# Patient Record
Sex: Male | Born: 1960
Health system: Southern US, Community
[De-identification: ages and names within clinical notes are randomized; demographics above are authoritative.]

## PROBLEM LIST (undated history)

## (undated) DIAGNOSIS — K449 Diaphragmatic hernia without obstruction or gangrene: Secondary | ICD-10-CM

## (undated) DIAGNOSIS — I10 Essential (primary) hypertension: Secondary | ICD-10-CM

## (undated) DIAGNOSIS — K2100 Gastro-esophageal reflux disease with esophagitis, without bleeding: Secondary | ICD-10-CM

## (undated) DIAGNOSIS — C801 Malignant (primary) neoplasm, unspecified: Secondary | ICD-10-CM

## (undated) DIAGNOSIS — F32A Depression, unspecified: Secondary | ICD-10-CM

## (undated) DIAGNOSIS — Z9989 Dependence on other enabling machines and devices: Secondary | ICD-10-CM

## (undated) DIAGNOSIS — R7989 Other specified abnormal findings of blood chemistry: Secondary | ICD-10-CM

## (undated) DIAGNOSIS — M1990 Unspecified osteoarthritis, unspecified site: Secondary | ICD-10-CM

## (undated) DIAGNOSIS — F329 Major depressive disorder, single episode, unspecified: Secondary | ICD-10-CM

## (undated) DIAGNOSIS — G4733 Obstructive sleep apnea (adult) (pediatric): Secondary | ICD-10-CM

## (undated) HISTORY — PX: REPLACEMENT TOTAL KNEE: SUR1224

## (undated) HISTORY — PX: SQUAMOUS CELL CARCINOMA EXCISION: SHX2433

## (undated) HISTORY — PX: OTHER SURGICAL HISTORY: SHX169

## (undated) HISTORY — DX: Diaphragmatic hernia without obstruction or gangrene: K44.9

## (undated) HISTORY — DX: Unspecified osteoarthritis, unspecified site: M19.90

## (undated) HISTORY — DX: Malignant (primary) neoplasm, unspecified: C80.1

## (undated) HISTORY — PX: KNEE ARTHROSCOPY: SUR90

## (undated) HISTORY — PX: BACK SURGERY: SHX140

## (undated) HISTORY — DX: Obstructive sleep apnea (adult) (pediatric): G47.33

## (undated) HISTORY — DX: Dependence on other enabling machines and devices: Z99.89

## (undated) HISTORY — DX: Major depressive disorder, single episode, unspecified: F32.9

## (undated) HISTORY — DX: Depression, unspecified: F32.A

## (undated) HISTORY — DX: Gastro-esophageal reflux disease with esophagitis: K21.0

## (undated) HISTORY — DX: Gastro-esophageal reflux disease with esophagitis, without bleeding: K21.00

## (undated) HISTORY — DX: Other specified abnormal findings of blood chemistry: R79.89

## (undated) HISTORY — PX: JOINT REPLACEMENT: SHX530

---

## 2001-07-21 ENCOUNTER — Ambulatory Visit (HOSPITAL_COMMUNITY): Admission: RE | Admit: 2001-07-21 | Discharge: 2001-07-21 | Payer: Self-pay | Admitting: Orthopedic Surgery

## 2001-07-21 ENCOUNTER — Encounter: Payer: Self-pay | Admitting: Orthopedic Surgery

## 2006-06-11 HISTORY — PX: REPLACEMENT TOTAL KNEE: SUR1224

## 2007-10-16 ENCOUNTER — Inpatient Hospital Stay (HOSPITAL_COMMUNITY): Admission: RE | Admit: 2007-10-16 | Discharge: 2007-10-18 | Payer: Self-pay | Admitting: Orthopedic Surgery

## 2007-10-22 ENCOUNTER — Ambulatory Visit: Payer: Self-pay | Admitting: Vascular Surgery

## 2007-10-22 ENCOUNTER — Ambulatory Visit (HOSPITAL_COMMUNITY): Admission: RE | Admit: 2007-10-22 | Discharge: 2007-10-22 | Payer: Self-pay | Admitting: Orthopedic Surgery

## 2007-10-22 ENCOUNTER — Encounter (INDEPENDENT_AMBULATORY_CARE_PROVIDER_SITE_OTHER): Payer: Self-pay | Admitting: Orthopedic Surgery

## 2008-05-19 ENCOUNTER — Ambulatory Visit (HOSPITAL_COMMUNITY): Admission: RE | Admit: 2008-05-19 | Discharge: 2008-05-19 | Payer: Self-pay | Admitting: Orthopedic Surgery

## 2008-06-11 HISTORY — PX: REPLACEMENT TOTAL KNEE: SUR1224

## 2008-10-06 ENCOUNTER — Emergency Department (HOSPITAL_COMMUNITY): Admission: EM | Admit: 2008-10-06 | Discharge: 2008-10-06 | Payer: Self-pay | Admitting: Emergency Medicine

## 2010-02-24 ENCOUNTER — Encounter: Admission: RE | Admit: 2010-02-24 | Discharge: 2010-02-24 | Payer: Self-pay | Admitting: Orthopedic Surgery

## 2010-04-04 ENCOUNTER — Inpatient Hospital Stay (HOSPITAL_COMMUNITY): Admission: RE | Admit: 2010-04-04 | Discharge: 2010-04-06 | Payer: Self-pay | Admitting: Orthopedic Surgery

## 2010-08-23 LAB — CBC
HCT: 32.4 % — ABNORMAL LOW (ref 39.0–52.0)
Hemoglobin: 11.1 g/dL — ABNORMAL LOW (ref 13.0–17.0)
MCH: 30.8 pg (ref 26.0–34.0)
MCHC: 34.6 g/dL (ref 30.0–36.0)
MCV: 90.4 fL (ref 78.0–100.0)
Platelets: 208 10*3/uL (ref 150–400)
RBC: 3.59 MIL/uL — ABNORMAL LOW (ref 4.22–5.81)
RBC: 3.83 MIL/uL — ABNORMAL LOW (ref 4.22–5.81)
RDW: 14.3 % (ref 11.5–15.5)
RDW: 14.7 % (ref 11.5–15.5)
RDW: 15 % (ref 11.5–15.5)
WBC: 6.5 10*3/uL (ref 4.0–10.5)
WBC: 7.7 10*3/uL (ref 4.0–10.5)

## 2010-08-23 LAB — BASIC METABOLIC PANEL
BUN: 13 mg/dL (ref 6–23)
BUN: 16 mg/dL (ref 6–23)
BUN: 17 mg/dL (ref 6–23)
CO2: 28 mEq/L (ref 19–32)
Calcium: 8.4 mg/dL (ref 8.4–10.5)
Calcium: 9.4 mg/dL (ref 8.4–10.5)
Chloride: 104 mEq/L (ref 96–112)
Chloride: 107 mEq/L (ref 96–112)
Creatinine, Ser: 1.1 mg/dL (ref 0.4–1.5)
Creatinine, Ser: 1.11 mg/dL (ref 0.4–1.5)
GFR calc Af Amer: >60 mL/min (ref >60)
GFR calc Af Amer: >60 mL/min (ref >60)
GFR calc non Af Amer: >60 mL/min (ref >60)
GFR calc non Af Amer: >60 mL/min (ref >60)
GFR calc non Af Amer: >60 mL/min (ref >60)
Glucose, Bld: 119 mg/dL — ABNORMAL HIGH (ref 70–99)
Potassium: 4.1 mEq/L (ref 3.5–5.1)
Sodium: 139 mEq/L (ref 135–145)

## 2010-08-23 LAB — URINALYSIS, ROUTINE W REFLEX MICROSCOPIC
Bilirubin Urine: NEGATIVE
Hgb urine dipstick: NEGATIVE
Ketones, ur: NEGATIVE mg/dL
Nitrite: NEGATIVE
Specific Gravity, Urine: 1.016 (ref 1.005–1.030)
pH: 6.5 (ref 5.0–8.0)

## 2010-08-23 LAB — TYPE AND SCREEN: ABO/RH(D): O POS

## 2010-08-23 LAB — DIFFERENTIAL
Basophils Absolute: 0 10*3/uL (ref 0.0–0.1)
Basophils Relative: 1 % (ref 0–1)
Eosinophils Absolute: 0.3 10*3/uL (ref 0.0–0.7)
Neutro Abs: 3.5 10*3/uL (ref 1.7–7.7)
Neutrophils Relative %: 57 % (ref 43–77)

## 2010-08-23 LAB — PROTIME-INR: INR: 0.97 (ref 0.00–1.49)

## 2010-08-23 LAB — APTT: aPTT: 31 seconds (ref 24–37)

## 2010-08-23 LAB — ABO/RH: ABO/RH(D): O POS

## 2010-08-23 LAB — SURGICAL PCR SCREEN: Staphylococcus aureus: NEGATIVE

## 2010-10-24 NOTE — Op Note (Signed)
Tony Frye, TALCOTT NO.:  [id number]   MEDICAL RECORD NO.:  [id number]          PATIENT TYPE:  INP   LOCATION:  5033                         FACILITY:  [facility]   PHYSICIAN:  Vania Rea. Supple, M.D.  DATE OF BIRTH:  [birth year]   DATE OF PROCEDURE:  10/16/2007  DATE OF DISCHARGE:                               OPERATIVE REPORT   PREOPERATIVE DIAGNOSIS:  Left knee osteoarthrosis.   POSTOPERATIVE DIAGNOSIS:  Left knee osteoarthrosis.   PROCEDURE:  Left total knee arthroplasty utilizing a cemented DePuy  segment implant size 4 femur, size 5 tibia, a 15-mm thick rotating  platform polyethylene insert, and a 38-mm patellar button.   SURGEON:  Vania Rea. Supple, MD   ASSISTANT:  Lucita Lora. Shuford, PA-C   ANESTHESIA:  General endotracheal.   TOURNIQUET TIME:  1 hour 45 minutes.   ESTIMATED BLOOD LOSS:  200 mL.   DRAINS:  Hemovac x1.   HISTORY:  [name] is a 50 year old gentleman who had initially  sustained a left knee dislocation a number of years ago for which I had  performed a multi-ligament reconstruction.  He has done relatively well  from a personal standpoint for many years, until more recently he has  developed progressively increasing pain as well as functional  limitations secondary to progressive arthritis of the knee.  The  examination shows significant restrictions in flexion with painful arc  of motion, although he has good quad tone.  His radiographs do confirm  evidence for diffuse arthrosis.  Due to his ongoing pain, functional  limitations, and failure to respond to prolonged attempts of  conservative management; he is brought to the operating room at this  time for planned left total knee arthroplasty.   Preoperatively, I had counseled [name] on treatment options as well  as risks versus benefits thereof.  Possible surgical complications,  bleeding, infection, neurovascular injury, DVT, PE, arthrofibrosis,  persistent pain, possible need  for revision of the implant, and  potential for anesthetic complications were reviewed.  He understands  and accepts and agrees with our planned procedure.   PROCEDURE IN DETAIL:  After undergoing routine preop evaluation, the  patient received prophylactic antibiotics.  He was placed supine on the  operating table and underwent smooth induction of general endotracheal  anesthesia.  Foley catheter was placed.  Tourniquet was applied on left  thigh.  Left leg was then sterilely prepped and draped in standard  fashion.  Leg was exsanguinated with the tourniquet inflated at 350  mmHg.  An anterior midline incision was then made approximately 20 cm in  length, centered over the patella.  Skin flaps were circumferentially  mobilized and sutured back.  A parapatellar arthrotomy was then  performed with electrocautery.  The knee was noted to be markedly  scarred with adhesions in the gutters as well as in the suprapatellar  pouch and distally around the infrapatellar tendon.  The patella was  circumferentially encased in scar tissue.  We performed an extensive  synovectomy with lysis of adhesions and resected all the intraarticular  scar.  We were then ultimately  able to evert the patella and flex the  knee up.  Remnants of the menisci were then removed as were the PCL  allograft, which we had placed many years ago.  Drill was then used to  gain access to the femoral medullary canal.  An intramedullary canal  finder was passed.  There was some difficulty passing into the femoral  intramedullary canal and ultimately, we did require fluoroscopic imaging  to confirm proper positioning.  We found that the canal was extremely  narrow and was markedly sclerotic.  We did ultimately confirm proper  passage of the intramedullary guide, and after this, we then made our  distal femoral cut, removing 11 mm of bone from the distal femur at a 5-  degree valgus alignment.  The distal femoral sizing guide was  then  placed and the size 4 had the proper fit.  We then placed a size 4  cutting guide and made the anterior, posterior, and chamfer cuts on the  distal femur.  The femoral trial showed excellent fit.  We then turned  our attention to the proximal tibia where we had to meticulously dissect  and free up scar tissue around the tibial plateau and mobilize patellar  tendon as well as the medial and lateral soft tissues.  We then used the  extramedullary guide to resect 10 mm of bone from the proximal tibia and  obtained proper alignment.  We then sized the proximal tibia and size 5  had the best coverage.  This was then pinned into position.  We  performed a trial reduction and it showed good flexion and extension  balance.  We then completed the terminal preparations for the tibia with  the reamer and broach.  We turned our attention to the distal femur  where we used the box cutting guide to cut the femoral box cut.  We then  placed the boxed femoral trial and then again performed a reduction and  again found excellent knee mobility with good stability and full  extension.  We then turned our attention to the patella where we removed  the periarticular scar tissue and then resected 9 mm of bone using an  oscillating saw and drilled the stabilizing drill holes for the 38-mm  patellar button.  At this point, we then performed pulsatile lavage of  the joint and meticulously cleaned all cut surfaces.  The posterior  compartments were also cleaned and we confirmed the bone had all been  properly resected.  The joints surfaces were then all meticulously dried  and cleaned.  Cement was mixed in the appropriate consistency.  We then  cemented the implants into position with the tibia, then the femur, and  then the patella.  Meticulous removal of all excess cement was then  completed.  We then performed a trial reductions with both the 12.5 and  a 50-mm implant and 50-mm polyethylene insert showed  excellent soft  tissue balance and good stability.  We then opened up the final 15-mm  rotating platform insert, and after confirming the joint was  meticulously cleaned, we inserted the tibial tray and again took the  knee through range of motion, it showed an excellent soft tissue balance  in full extension and good stability.  At this point then, the  tourniquet was let down.  Hemostasis was obtained with electrocautery.  Hemovac drain was brought out superolaterally.  We closed the  parapatellar arthrotomy with interrupted figure-of-eight #1 Vicryl  sutures, 2-0 Vicryl  was used for the subcu, and intracuticular 3-0  Monocryl for the skin followed by Steri-Strips.  Bulky dry dressing was  applied.  The patient was then extubated and taken to the recovery room  in stable condition.      Vania Rea. Supple, M.D.  Electronically Signed     KMS/MEDQ  D:  10/16/2007  T:  10/17/2007  Job:  [id number]

## 2010-10-24 NOTE — Consult Note (Signed)
NAMEFELICIA, BOTH NO.:  [id number]   MEDICAL RECORD NO.:  [id number]          PATIENT TYPE:  EMS   LOCATION:  MAJO                         FACILITY:  [facility]   PHYSICIAN:  Tony Done, MD  DATE OF BIRTH:  [birth year]   DATE OF CONSULTATION:  10/06/2008  DATE OF DISCHARGE:  10/06/2008                                 CONSULTATION   REQUESTING PHYSICIAN:  Zadie Rhine, MD   REASON FOR CONSULTATION:  Left thumb tip injury.   BRIEF HISTORY:  Tony Frye is a 50 year old right-hand dominant gentleman  who sustained an injury to his nondominant left thumb with a wood  platter.  The patient was brought in by EMS after the injury to his  thumb.  The patient did lose the distal tip of his thumb and they did  place it in a plastic bag, put in ice, and bring it in with him.  He was  wearing gloves when it occurred.  They were able to remove the small  part out of the glove after it happened.  He comes in today without any  other new concerns.   His tetanus shot is up-to-date and was within the last 5 years.  He  denies any other injury to his digits.   PAST MEDICAL HISTORY:  1. Reflux disease.  2. Mood disorder.   PAST SURGICAL HISTORY:  Multiple knee surgeries including total knee  replacement within the last year.   ALLERGIES TO MEDICATIONS:  1. CODEINE.  2. PENICILLIN.   SOCIAL HISTORY:  He is a nonsmoker and does not drink, no history of  alcohol use.   REVIEW OF SYSTEMS:  No recent illnesses or hospitalizations.   PHYSICAL EXAMINATION:  GENERAL:  He is a healthy-appearing male, 50  years of age.  No acute distress.  VITAL SIGNS:  Temperature 98.5, blood pressure 137/86, heart rate 72,  and respirations 16.  EXTREMITIES:  On examination of the left, the patient does have distal  thumb skin loss.  It is a full-thickness skin loss and distal part was  analyzed.  It did not include any bone.  It is not including any portion  of the nail.  A  full-thickness skin loss with small amounts of fat  within the skin.  The nailbed was not lacerated.  There was no exposed  bone.  He is able to extend his thumb, flex the thumb IP joint, make the  AOK sign across his finger.  His fingertips were warm, well-perfused,  good capillary refill, and good sensation.   Radiographs of the thumb do show the soft tissue defect along the distal  tip along the ulnar border of the thumb.  No evidence of fracture.   IMPRESSION:  Left thumb distal tip injury with soft tissue loss and skin  loss, full-thickness.   PLAN:  Today, the findings were reviewed with Tony Frye.  We talked  about the good quality of the skin that  was removed that he brought in.  It did not appear to be contaminated.  I do think this would be amenable  for attempted full-thickness skin grafting.  The patient agreed to  proceed.  The finger was anesthetized with 10 mL of 1% lidocaine.  A  local block was performed.  The patient tolerated this well.  A small  finger tourniquet was then applied to the digit.  The patient tolerated  this as well.  Following this, the area was then thoroughly irrigated  with saline pulsatile irrigation.  It allowed for good, adequate  exposure of the distal tip.  Following this, the debridement of the skin  and subcutaneous tissues were then Frye to be sure, to  remove any  devitalized tissue.  Following debridement, the skin graft was then  prepared by removing the subcutaneous fat from the skin graft, making a  couple of small fenestrations with the scalpel within the skin graft  allow for bleeding to recur and to prevent blood accumulation beneath  the graft.  The graft was then after preparation was then sewn in place  with a 4-0 Prolene suture simple and a running suture creating a good  closure directly over the defect.  Adaptic dressing and sterile  compressive bandage were then applied.  The patient was then placed in a  small finger  splint.  He tolerated the procedure well.   The patient will be discharged to home and will be seen back in the  office in approximately 4-5 days for repeat wound check and evaluation.  The patient may require further intervention depending on whether the  skin graft takes.  All questions were answered today.      Tony Done, MD  Electronically Signed     FWO/MEDQ  D:  10/10/2008  T:  10/11/2008  Job:  [id number]

## 2013-01-29 ENCOUNTER — Other Ambulatory Visit (HOSPITAL_BASED_OUTPATIENT_CLINIC_OR_DEPARTMENT_OTHER): Payer: Self-pay | Admitting: Family Medicine

## 2013-01-29 DIAGNOSIS — R1031 Right lower quadrant pain: Secondary | ICD-10-CM

## 2013-02-02 ENCOUNTER — Ambulatory Visit (HOSPITAL_BASED_OUTPATIENT_CLINIC_OR_DEPARTMENT_OTHER)
Admission: RE | Admit: 2013-02-02 | Discharge: 2013-02-02 | Disposition: A | Discharge status: Home / Self Care | Payer: 59 | Source: Ambulatory Visit | Attending: Family Medicine | Admitting: Family Medicine

## 2013-02-02 DIAGNOSIS — I700 Atherosclerosis of aorta: Secondary | ICD-10-CM | POA: Insufficient documentation

## 2013-02-02 DIAGNOSIS — R1031 Right lower quadrant pain: Secondary | ICD-10-CM | POA: Insufficient documentation

## 2013-04-07 ENCOUNTER — Ambulatory Visit: Payer: 59 | Admitting: Cardiology

## 2014-11-17 ENCOUNTER — Other Ambulatory Visit (HOSPITAL_BASED_OUTPATIENT_CLINIC_OR_DEPARTMENT_OTHER): Payer: Self-pay | Admitting: Family Medicine

## 2014-11-17 DIAGNOSIS — M5416 Radiculopathy, lumbar region: Secondary | ICD-10-CM

## 2014-11-17 DIAGNOSIS — M545 Low back pain: Secondary | ICD-10-CM

## 2014-11-22 ENCOUNTER — Ambulatory Visit (INDEPENDENT_AMBULATORY_CARE_PROVIDER_SITE_OTHER): Payer: 59

## 2014-11-22 DIAGNOSIS — M5136 Other intervertebral disc degeneration, lumbar region: Secondary | ICD-10-CM

## 2014-11-22 DIAGNOSIS — M4806 Spinal stenosis, lumbar region: Secondary | ICD-10-CM | POA: Diagnosis not present

## 2014-11-22 DIAGNOSIS — M5416 Radiculopathy, lumbar region: Secondary | ICD-10-CM

## 2014-11-22 DIAGNOSIS — M545 Low back pain: Secondary | ICD-10-CM

## 2015-07-26 DIAGNOSIS — F1020 Alcohol dependence, uncomplicated: Secondary | ICD-10-CM | POA: Insufficient documentation

## 2015-07-26 DIAGNOSIS — F330 Major depressive disorder, recurrent, mild: Secondary | ICD-10-CM | POA: Insufficient documentation

## 2015-11-09 DIAGNOSIS — M180 Bilateral primary osteoarthritis of first carpometacarpal joints: Secondary | ICD-10-CM | POA: Insufficient documentation

## 2015-11-09 DIAGNOSIS — G5603 Carpal tunnel syndrome, bilateral upper limbs: Secondary | ICD-10-CM | POA: Insufficient documentation

## 2016-05-15 DIAGNOSIS — J209 Acute bronchitis, unspecified: Secondary | ICD-10-CM | POA: Diagnosis not present

## 2016-07-30 DIAGNOSIS — F3340 Major depressive disorder, recurrent, in remission, unspecified: Secondary | ICD-10-CM | POA: Diagnosis not present

## 2016-07-30 DIAGNOSIS — Z125 Encounter for screening for malignant neoplasm of prostate: Secondary | ICD-10-CM | POA: Diagnosis not present

## 2016-07-30 DIAGNOSIS — E291 Testicular hypofunction: Secondary | ICD-10-CM | POA: Diagnosis not present

## 2016-07-30 DIAGNOSIS — I10 Essential (primary) hypertension: Secondary | ICD-10-CM | POA: Diagnosis not present

## 2016-07-30 DIAGNOSIS — K219 Gastro-esophageal reflux disease without esophagitis: Secondary | ICD-10-CM | POA: Diagnosis not present

## 2016-07-30 DIAGNOSIS — Z0000 Encounter for general adult medical examination without abnormal findings: Secondary | ICD-10-CM | POA: Diagnosis not present

## 2016-07-30 DIAGNOSIS — E7800 Pure hypercholesterolemia, unspecified: Secondary | ICD-10-CM | POA: Diagnosis not present

## 2016-11-16 DIAGNOSIS — F3340 Major depressive disorder, recurrent, in remission, unspecified: Secondary | ICD-10-CM | POA: Diagnosis not present

## 2016-12-25 DIAGNOSIS — M1990 Unspecified osteoarthritis, unspecified site: Secondary | ICD-10-CM | POA: Diagnosis not present

## 2016-12-25 DIAGNOSIS — F339 Major depressive disorder, recurrent, unspecified: Secondary | ICD-10-CM | POA: Diagnosis not present

## 2016-12-25 DIAGNOSIS — J309 Allergic rhinitis, unspecified: Secondary | ICD-10-CM | POA: Diagnosis not present

## 2017-04-15 DIAGNOSIS — Z6832 Body mass index (BMI) 32.0-32.9, adult: Secondary | ICD-10-CM | POA: Diagnosis not present

## 2017-04-15 DIAGNOSIS — M519 Unspecified thoracic, thoracolumbar and lumbosacral intervertebral disc disorder: Secondary | ICD-10-CM | POA: Diagnosis not present

## 2017-06-27 DIAGNOSIS — I10 Essential (primary) hypertension: Secondary | ICD-10-CM | POA: Diagnosis not present

## 2017-06-27 DIAGNOSIS — M545 Low back pain: Secondary | ICD-10-CM | POA: Diagnosis not present

## 2017-06-27 DIAGNOSIS — F3340 Major depressive disorder, recurrent, in remission, unspecified: Secondary | ICD-10-CM | POA: Diagnosis not present

## 2017-06-27 DIAGNOSIS — E7800 Pure hypercholesterolemia, unspecified: Secondary | ICD-10-CM | POA: Diagnosis not present

## 2017-06-27 DIAGNOSIS — E291 Testicular hypofunction: Secondary | ICD-10-CM | POA: Diagnosis not present

## 2017-07-29 DIAGNOSIS — M545 Low back pain: Secondary | ICD-10-CM | POA: Diagnosis not present

## 2017-07-29 DIAGNOSIS — F3340 Major depressive disorder, recurrent, in remission, unspecified: Secondary | ICD-10-CM | POA: Diagnosis not present

## 2017-08-26 DIAGNOSIS — F3340 Major depressive disorder, recurrent, in remission, unspecified: Secondary | ICD-10-CM | POA: Diagnosis not present

## 2017-08-26 DIAGNOSIS — M5442 Lumbago with sciatica, left side: Secondary | ICD-10-CM | POA: Diagnosis not present

## 2017-08-26 DIAGNOSIS — J3489 Other specified disorders of nose and nasal sinuses: Secondary | ICD-10-CM | POA: Diagnosis not present

## 2017-12-26 DIAGNOSIS — H9201 Otalgia, right ear: Secondary | ICD-10-CM | POA: Diagnosis not present

## 2017-12-26 DIAGNOSIS — H6591 Unspecified nonsuppurative otitis media, right ear: Secondary | ICD-10-CM | POA: Diagnosis not present

## 2017-12-26 DIAGNOSIS — H60331 Swimmer's ear, right ear: Secondary | ICD-10-CM | POA: Diagnosis not present

## 2018-01-15 DIAGNOSIS — F3340 Major depressive disorder, recurrent, in remission, unspecified: Secondary | ICD-10-CM | POA: Diagnosis not present

## 2018-01-15 DIAGNOSIS — H6241 Otitis externa in other diseases classified elsewhere, right ear: Secondary | ICD-10-CM | POA: Diagnosis not present

## 2018-01-15 DIAGNOSIS — J309 Allergic rhinitis, unspecified: Secondary | ICD-10-CM | POA: Diagnosis not present

## 2018-01-15 DIAGNOSIS — B369 Superficial mycosis, unspecified: Secondary | ICD-10-CM | POA: Diagnosis not present

## 2018-03-10 DIAGNOSIS — M5126 Other intervertebral disc displacement, lumbar region: Secondary | ICD-10-CM | POA: Diagnosis not present

## 2018-03-14 DIAGNOSIS — Z9889 Other specified postprocedural states: Secondary | ICD-10-CM | POA: Diagnosis not present

## 2018-03-14 DIAGNOSIS — M5442 Lumbago with sciatica, left side: Secondary | ICD-10-CM | POA: Diagnosis not present

## 2018-03-18 DIAGNOSIS — Z01812 Encounter for preprocedural laboratory examination: Secondary | ICD-10-CM | POA: Diagnosis not present

## 2018-03-25 DIAGNOSIS — M5126 Other intervertebral disc displacement, lumbar region: Secondary | ICD-10-CM | POA: Diagnosis not present

## 2018-03-27 DIAGNOSIS — M5126 Other intervertebral disc displacement, lumbar region: Secondary | ICD-10-CM | POA: Diagnosis not present

## 2018-03-27 DIAGNOSIS — M519 Unspecified thoracic, thoracolumbar and lumbosacral intervertebral disc disorder: Secondary | ICD-10-CM | POA: Diagnosis not present

## 2018-04-14 DIAGNOSIS — M5126 Other intervertebral disc displacement, lumbar region: Secondary | ICD-10-CM | POA: Diagnosis not present

## 2018-07-31 DIAGNOSIS — F439 Reaction to severe stress, unspecified: Secondary | ICD-10-CM | POA: Diagnosis not present

## 2018-07-31 DIAGNOSIS — F3340 Major depressive disorder, recurrent, in remission, unspecified: Secondary | ICD-10-CM | POA: Diagnosis not present

## 2018-10-17 DIAGNOSIS — M79674 Pain in right toe(s): Secondary | ICD-10-CM | POA: Diagnosis not present

## 2019-01-21 DIAGNOSIS — Z1159 Encounter for screening for other viral diseases: Secondary | ICD-10-CM | POA: Diagnosis not present

## 2019-01-21 DIAGNOSIS — R197 Diarrhea, unspecified: Secondary | ICD-10-CM | POA: Diagnosis not present

## 2019-03-16 DIAGNOSIS — E7800 Pure hypercholesterolemia, unspecified: Secondary | ICD-10-CM | POA: Diagnosis not present

## 2019-03-16 DIAGNOSIS — I10 Essential (primary) hypertension: Secondary | ICD-10-CM | POA: Diagnosis not present

## 2019-03-16 DIAGNOSIS — Z0000 Encounter for general adult medical examination without abnormal findings: Secondary | ICD-10-CM | POA: Diagnosis not present

## 2019-03-16 DIAGNOSIS — Z23 Encounter for immunization: Secondary | ICD-10-CM | POA: Diagnosis not present

## 2019-04-16 DIAGNOSIS — T84038A Mechanical loosening of other internal prosthetic joint, initial encounter: Secondary | ICD-10-CM | POA: Diagnosis not present

## 2019-04-16 DIAGNOSIS — Z96659 Presence of unspecified artificial knee joint: Secondary | ICD-10-CM | POA: Diagnosis not present

## 2019-04-16 DIAGNOSIS — G8929 Other chronic pain: Secondary | ICD-10-CM | POA: Diagnosis not present

## 2019-04-16 DIAGNOSIS — M25562 Pain in left knee: Secondary | ICD-10-CM | POA: Diagnosis not present

## 2019-05-11 DIAGNOSIS — N289 Disorder of kidney and ureter, unspecified: Secondary | ICD-10-CM | POA: Diagnosis not present

## 2019-05-12 DIAGNOSIS — R9389 Abnormal findings on diagnostic imaging of other specified body structures: Secondary | ICD-10-CM | POA: Diagnosis not present

## 2019-05-12 DIAGNOSIS — T84038A Mechanical loosening of other internal prosthetic joint, initial encounter: Secondary | ICD-10-CM | POA: Diagnosis not present

## 2019-05-12 DIAGNOSIS — Z471 Aftercare following joint replacement surgery: Secondary | ICD-10-CM | POA: Diagnosis not present

## 2019-05-12 DIAGNOSIS — Z96652 Presence of left artificial knee joint: Secondary | ICD-10-CM | POA: Diagnosis not present

## 2019-05-14 DIAGNOSIS — F1020 Alcohol dependence, uncomplicated: Secondary | ICD-10-CM | POA: Diagnosis not present

## 2019-05-14 DIAGNOSIS — Z96659 Presence of unspecified artificial knee joint: Secondary | ICD-10-CM | POA: Diagnosis not present

## 2019-05-14 DIAGNOSIS — T84038A Mechanical loosening of other internal prosthetic joint, initial encounter: Secondary | ICD-10-CM | POA: Diagnosis not present

## 2019-07-20 DIAGNOSIS — E291 Testicular hypofunction: Secondary | ICD-10-CM | POA: Diagnosis not present

## 2019-08-10 DIAGNOSIS — M1990 Unspecified osteoarthritis, unspecified site: Secondary | ICD-10-CM | POA: Diagnosis not present

## 2019-08-10 DIAGNOSIS — R350 Frequency of micturition: Secondary | ICD-10-CM | POA: Diagnosis not present

## 2019-08-10 DIAGNOSIS — R1032 Left lower quadrant pain: Secondary | ICD-10-CM | POA: Diagnosis not present

## 2019-08-10 DIAGNOSIS — M545 Low back pain: Secondary | ICD-10-CM | POA: Diagnosis not present

## 2019-09-17 DIAGNOSIS — Z23 Encounter for immunization: Secondary | ICD-10-CM | POA: Diagnosis not present

## 2019-09-28 DIAGNOSIS — M5126 Other intervertebral disc displacement, lumbar region: Secondary | ICD-10-CM | POA: Diagnosis not present

## 2019-10-26 DIAGNOSIS — M5126 Other intervertebral disc displacement, lumbar region: Secondary | ICD-10-CM | POA: Diagnosis not present

## 2019-10-26 DIAGNOSIS — I10 Essential (primary) hypertension: Secondary | ICD-10-CM | POA: Diagnosis not present

## 2019-10-26 DIAGNOSIS — M519 Unspecified thoracic, thoracolumbar and lumbosacral intervertebral disc disorder: Secondary | ICD-10-CM | POA: Diagnosis not present

## 2019-11-02 DIAGNOSIS — T84038A Mechanical loosening of other internal prosthetic joint, initial encounter: Secondary | ICD-10-CM | POA: Diagnosis not present

## 2019-11-02 DIAGNOSIS — Z96659 Presence of unspecified artificial knee joint: Secondary | ICD-10-CM | POA: Diagnosis not present

## 2019-11-05 DIAGNOSIS — T84038A Mechanical loosening of other internal prosthetic joint, initial encounter: Secondary | ICD-10-CM | POA: Diagnosis not present

## 2019-11-05 DIAGNOSIS — Z96659 Presence of unspecified artificial knee joint: Secondary | ICD-10-CM | POA: Diagnosis not present

## 2019-11-06 DIAGNOSIS — Z96659 Presence of unspecified artificial knee joint: Secondary | ICD-10-CM | POA: Diagnosis not present

## 2019-11-06 DIAGNOSIS — T84038A Mechanical loosening of other internal prosthetic joint, initial encounter: Secondary | ICD-10-CM | POA: Diagnosis not present

## 2019-12-01 DIAGNOSIS — Z96652 Presence of left artificial knee joint: Secondary | ICD-10-CM | POA: Diagnosis not present

## 2019-12-03 DIAGNOSIS — T84038A Mechanical loosening of other internal prosthetic joint, initial encounter: Secondary | ICD-10-CM | POA: Diagnosis not present

## 2019-12-03 DIAGNOSIS — Z96659 Presence of unspecified artificial knee joint: Secondary | ICD-10-CM | POA: Diagnosis not present

## 2019-12-03 DIAGNOSIS — M1120 Other chondrocalcinosis, unspecified site: Secondary | ICD-10-CM | POA: Diagnosis not present

## 2019-12-24 DIAGNOSIS — T84038A Mechanical loosening of other internal prosthetic joint, initial encounter: Secondary | ICD-10-CM | POA: Diagnosis not present

## 2019-12-24 DIAGNOSIS — Z96659 Presence of unspecified artificial knee joint: Secondary | ICD-10-CM | POA: Diagnosis not present

## 2019-12-24 DIAGNOSIS — M1120 Other chondrocalcinosis, unspecified site: Secondary | ICD-10-CM | POA: Diagnosis not present

## 2019-12-29 DIAGNOSIS — T84093A Other mechanical complication of internal left knee prosthesis, initial encounter: Secondary | ICD-10-CM | POA: Insufficient documentation

## 2020-01-06 DIAGNOSIS — Z01818 Encounter for other preprocedural examination: Secondary | ICD-10-CM | POA: Diagnosis not present

## 2020-01-06 DIAGNOSIS — Z96652 Presence of left artificial knee joint: Secondary | ICD-10-CM | POA: Diagnosis not present

## 2020-01-06 DIAGNOSIS — Z01810 Encounter for preprocedural cardiovascular examination: Secondary | ICD-10-CM | POA: Diagnosis not present

## 2020-01-06 DIAGNOSIS — Z01812 Encounter for preprocedural laboratory examination: Secondary | ICD-10-CM | POA: Diagnosis not present

## 2020-01-06 DIAGNOSIS — X58XXXD Exposure to other specified factors, subsequent encounter: Secondary | ICD-10-CM | POA: Diagnosis not present

## 2020-01-06 DIAGNOSIS — T84093D Other mechanical complication of internal left knee prosthesis, subsequent encounter: Secondary | ICD-10-CM | POA: Diagnosis not present

## 2020-01-12 DIAGNOSIS — G8918 Other acute postprocedural pain: Secondary | ICD-10-CM | POA: Diagnosis not present

## 2020-01-12 DIAGNOSIS — E785 Hyperlipidemia, unspecified: Secondary | ICD-10-CM | POA: Diagnosis not present

## 2020-01-12 DIAGNOSIS — T84033A Mechanical loosening of internal left knee prosthetic joint, initial encounter: Secondary | ICD-10-CM | POA: Diagnosis not present

## 2020-01-12 DIAGNOSIS — G4733 Obstructive sleep apnea (adult) (pediatric): Secondary | ICD-10-CM | POA: Diagnosis not present

## 2020-01-12 DIAGNOSIS — Z79899 Other long term (current) drug therapy: Secondary | ICD-10-CM | POA: Diagnosis not present

## 2020-01-12 DIAGNOSIS — Z96652 Presence of left artificial knee joint: Secondary | ICD-10-CM | POA: Diagnosis not present

## 2020-01-12 DIAGNOSIS — Z880 Allergy status to penicillin: Secondary | ICD-10-CM | POA: Diagnosis not present

## 2020-01-12 DIAGNOSIS — Z87891 Personal history of nicotine dependence: Secondary | ICD-10-CM | POA: Diagnosis not present

## 2020-01-12 DIAGNOSIS — Z888 Allergy status to other drugs, medicaments and biological substances status: Secondary | ICD-10-CM | POA: Diagnosis not present

## 2020-01-12 DIAGNOSIS — Z96651 Presence of right artificial knee joint: Secondary | ICD-10-CM | POA: Diagnosis not present

## 2020-01-12 DIAGNOSIS — K219 Gastro-esophageal reflux disease without esophagitis: Secondary | ICD-10-CM | POA: Diagnosis not present

## 2020-01-12 DIAGNOSIS — F330 Major depressive disorder, recurrent, mild: Secondary | ICD-10-CM | POA: Diagnosis not present

## 2020-01-12 DIAGNOSIS — R2681 Unsteadiness on feet: Secondary | ICD-10-CM | POA: Diagnosis not present

## 2020-01-12 DIAGNOSIS — Z885 Allergy status to narcotic agent status: Secondary | ICD-10-CM | POA: Diagnosis not present

## 2020-01-12 DIAGNOSIS — I10 Essential (primary) hypertension: Secondary | ICD-10-CM | POA: Diagnosis not present

## 2020-01-12 DIAGNOSIS — Z791 Long term (current) use of non-steroidal anti-inflammatories (NSAID): Secondary | ICD-10-CM | POA: Diagnosis not present

## 2020-01-12 DIAGNOSIS — Z471 Aftercare following joint replacement surgery: Secondary | ICD-10-CM | POA: Diagnosis not present

## 2020-01-12 DIAGNOSIS — R2689 Other abnormalities of gait and mobility: Secondary | ICD-10-CM | POA: Diagnosis not present

## 2020-01-13 DIAGNOSIS — T84033A Mechanical loosening of internal left knee prosthetic joint, initial encounter: Secondary | ICD-10-CM | POA: Diagnosis not present

## 2020-01-13 DIAGNOSIS — Z87891 Personal history of nicotine dependence: Secondary | ICD-10-CM | POA: Diagnosis not present

## 2020-01-13 DIAGNOSIS — K219 Gastro-esophageal reflux disease without esophagitis: Secondary | ICD-10-CM | POA: Diagnosis not present

## 2020-01-13 DIAGNOSIS — I10 Essential (primary) hypertension: Secondary | ICD-10-CM | POA: Diagnosis not present

## 2020-01-13 DIAGNOSIS — G8918 Other acute postprocedural pain: Secondary | ICD-10-CM | POA: Diagnosis not present

## 2020-01-13 DIAGNOSIS — G4733 Obstructive sleep apnea (adult) (pediatric): Secondary | ICD-10-CM | POA: Diagnosis not present

## 2020-01-13 DIAGNOSIS — Z888 Allergy status to other drugs, medicaments and biological substances status: Secondary | ICD-10-CM | POA: Diagnosis not present

## 2020-01-13 DIAGNOSIS — Z96651 Presence of right artificial knee joint: Secondary | ICD-10-CM | POA: Diagnosis not present

## 2020-01-13 DIAGNOSIS — E785 Hyperlipidemia, unspecified: Secondary | ICD-10-CM | POA: Diagnosis not present

## 2020-01-13 DIAGNOSIS — F330 Major depressive disorder, recurrent, mild: Secondary | ICD-10-CM | POA: Diagnosis not present

## 2020-01-13 DIAGNOSIS — R2689 Other abnormalities of gait and mobility: Secondary | ICD-10-CM | POA: Diagnosis not present

## 2020-01-13 DIAGNOSIS — R2681 Unsteadiness on feet: Secondary | ICD-10-CM | POA: Diagnosis not present

## 2020-01-13 DIAGNOSIS — Z885 Allergy status to narcotic agent status: Secondary | ICD-10-CM | POA: Diagnosis not present

## 2020-01-13 DIAGNOSIS — Z791 Long term (current) use of non-steroidal anti-inflammatories (NSAID): Secondary | ICD-10-CM | POA: Diagnosis not present

## 2020-01-13 DIAGNOSIS — Z880 Allergy status to penicillin: Secondary | ICD-10-CM | POA: Diagnosis not present

## 2020-01-13 DIAGNOSIS — Z79899 Other long term (current) drug therapy: Secondary | ICD-10-CM | POA: Diagnosis not present

## 2020-01-14 DIAGNOSIS — I10 Essential (primary) hypertension: Secondary | ICD-10-CM | POA: Diagnosis not present

## 2020-01-14 DIAGNOSIS — G8918 Other acute postprocedural pain: Secondary | ICD-10-CM | POA: Diagnosis not present

## 2020-01-14 DIAGNOSIS — T84033A Mechanical loosening of internal left knee prosthetic joint, initial encounter: Secondary | ICD-10-CM | POA: Diagnosis not present

## 2020-01-14 DIAGNOSIS — Z96651 Presence of right artificial knee joint: Secondary | ICD-10-CM | POA: Diagnosis not present

## 2020-01-14 DIAGNOSIS — R2681 Unsteadiness on feet: Secondary | ICD-10-CM | POA: Diagnosis not present

## 2020-01-14 DIAGNOSIS — R2689 Other abnormalities of gait and mobility: Secondary | ICD-10-CM | POA: Diagnosis not present

## 2020-01-14 DIAGNOSIS — Z79899 Other long term (current) drug therapy: Secondary | ICD-10-CM | POA: Diagnosis not present

## 2020-01-14 DIAGNOSIS — K219 Gastro-esophageal reflux disease without esophagitis: Secondary | ICD-10-CM | POA: Diagnosis not present

## 2020-01-14 DIAGNOSIS — G4733 Obstructive sleep apnea (adult) (pediatric): Secondary | ICD-10-CM | POA: Diagnosis not present

## 2020-01-14 DIAGNOSIS — Z888 Allergy status to other drugs, medicaments and biological substances status: Secondary | ICD-10-CM | POA: Diagnosis not present

## 2020-01-14 DIAGNOSIS — Z87891 Personal history of nicotine dependence: Secondary | ICD-10-CM | POA: Diagnosis not present

## 2020-01-14 DIAGNOSIS — E785 Hyperlipidemia, unspecified: Secondary | ICD-10-CM | POA: Diagnosis not present

## 2020-01-14 DIAGNOSIS — Z880 Allergy status to penicillin: Secondary | ICD-10-CM | POA: Diagnosis not present

## 2020-01-14 DIAGNOSIS — R05 Cough: Secondary | ICD-10-CM | POA: Diagnosis not present

## 2020-01-14 DIAGNOSIS — Z791 Long term (current) use of non-steroidal anti-inflammatories (NSAID): Secondary | ICD-10-CM | POA: Diagnosis not present

## 2020-01-14 DIAGNOSIS — Z885 Allergy status to narcotic agent status: Secondary | ICD-10-CM | POA: Diagnosis not present

## 2020-01-14 DIAGNOSIS — F330 Major depressive disorder, recurrent, mild: Secondary | ICD-10-CM | POA: Diagnosis not present

## 2020-01-15 DIAGNOSIS — R2689 Other abnormalities of gait and mobility: Secondary | ICD-10-CM | POA: Diagnosis not present

## 2020-01-15 DIAGNOSIS — E785 Hyperlipidemia, unspecified: Secondary | ICD-10-CM | POA: Diagnosis not present

## 2020-01-15 DIAGNOSIS — Z791 Long term (current) use of non-steroidal anti-inflammatories (NSAID): Secondary | ICD-10-CM | POA: Diagnosis not present

## 2020-01-15 DIAGNOSIS — R2681 Unsteadiness on feet: Secondary | ICD-10-CM | POA: Diagnosis not present

## 2020-01-15 DIAGNOSIS — Z96652 Presence of left artificial knee joint: Secondary | ICD-10-CM | POA: Insufficient documentation

## 2020-01-15 DIAGNOSIS — Z96651 Presence of right artificial knee joint: Secondary | ICD-10-CM | POA: Diagnosis not present

## 2020-01-15 DIAGNOSIS — G4733 Obstructive sleep apnea (adult) (pediatric): Secondary | ICD-10-CM | POA: Diagnosis not present

## 2020-01-15 DIAGNOSIS — K219 Gastro-esophageal reflux disease without esophagitis: Secondary | ICD-10-CM | POA: Diagnosis not present

## 2020-01-15 DIAGNOSIS — F330 Major depressive disorder, recurrent, mild: Secondary | ICD-10-CM | POA: Diagnosis not present

## 2020-01-15 DIAGNOSIS — Z880 Allergy status to penicillin: Secondary | ICD-10-CM | POA: Diagnosis not present

## 2020-01-15 DIAGNOSIS — Z87891 Personal history of nicotine dependence: Secondary | ICD-10-CM | POA: Diagnosis not present

## 2020-01-15 DIAGNOSIS — G8918 Other acute postprocedural pain: Secondary | ICD-10-CM | POA: Diagnosis not present

## 2020-01-15 DIAGNOSIS — Z79899 Other long term (current) drug therapy: Secondary | ICD-10-CM | POA: Diagnosis not present

## 2020-01-15 DIAGNOSIS — I10 Essential (primary) hypertension: Secondary | ICD-10-CM | POA: Diagnosis not present

## 2020-01-15 DIAGNOSIS — Z888 Allergy status to other drugs, medicaments and biological substances status: Secondary | ICD-10-CM | POA: Diagnosis not present

## 2020-01-15 DIAGNOSIS — T84033A Mechanical loosening of internal left knee prosthetic joint, initial encounter: Secondary | ICD-10-CM | POA: Diagnosis not present

## 2020-01-15 DIAGNOSIS — Z885 Allergy status to narcotic agent status: Secondary | ICD-10-CM | POA: Diagnosis not present

## 2020-01-16 DIAGNOSIS — Z87891 Personal history of nicotine dependence: Secondary | ICD-10-CM | POA: Diagnosis not present

## 2020-01-16 DIAGNOSIS — Z9181 History of falling: Secondary | ICD-10-CM | POA: Diagnosis not present

## 2020-01-16 DIAGNOSIS — Z7982 Long term (current) use of aspirin: Secondary | ICD-10-CM | POA: Diagnosis not present

## 2020-01-16 DIAGNOSIS — Z96651 Presence of right artificial knee joint: Secondary | ICD-10-CM | POA: Diagnosis not present

## 2020-01-16 DIAGNOSIS — T84033D Mechanical loosening of internal left knee prosthetic joint, subsequent encounter: Secondary | ICD-10-CM | POA: Diagnosis not present

## 2020-01-16 DIAGNOSIS — T84013D Broken internal left knee prosthesis, subsequent encounter: Secondary | ICD-10-CM | POA: Diagnosis not present

## 2020-01-16 DIAGNOSIS — F329 Major depressive disorder, single episode, unspecified: Secondary | ICD-10-CM | POA: Diagnosis not present

## 2020-01-16 DIAGNOSIS — I10 Essential (primary) hypertension: Secondary | ICD-10-CM | POA: Diagnosis not present

## 2020-01-16 DIAGNOSIS — Z9981 Dependence on supplemental oxygen: Secondary | ICD-10-CM | POA: Diagnosis not present

## 2020-01-16 DIAGNOSIS — G4733 Obstructive sleep apnea (adult) (pediatric): Secondary | ICD-10-CM | POA: Diagnosis not present

## 2020-01-18 DIAGNOSIS — Z9181 History of falling: Secondary | ICD-10-CM | POA: Diagnosis not present

## 2020-01-18 DIAGNOSIS — Z96651 Presence of right artificial knee joint: Secondary | ICD-10-CM | POA: Diagnosis not present

## 2020-01-18 DIAGNOSIS — Z9981 Dependence on supplemental oxygen: Secondary | ICD-10-CM | POA: Diagnosis not present

## 2020-01-18 DIAGNOSIS — G4733 Obstructive sleep apnea (adult) (pediatric): Secondary | ICD-10-CM | POA: Diagnosis not present

## 2020-01-18 DIAGNOSIS — I10 Essential (primary) hypertension: Secondary | ICD-10-CM | POA: Diagnosis not present

## 2020-01-18 DIAGNOSIS — Z87891 Personal history of nicotine dependence: Secondary | ICD-10-CM | POA: Diagnosis not present

## 2020-01-18 DIAGNOSIS — Z7982 Long term (current) use of aspirin: Secondary | ICD-10-CM | POA: Diagnosis not present

## 2020-01-18 DIAGNOSIS — T84033D Mechanical loosening of internal left knee prosthetic joint, subsequent encounter: Secondary | ICD-10-CM | POA: Diagnosis not present

## 2020-01-18 DIAGNOSIS — F329 Major depressive disorder, single episode, unspecified: Secondary | ICD-10-CM | POA: Diagnosis not present

## 2020-01-18 DIAGNOSIS — T84013D Broken internal left knee prosthesis, subsequent encounter: Secondary | ICD-10-CM | POA: Diagnosis not present

## 2020-01-20 DIAGNOSIS — Z9981 Dependence on supplemental oxygen: Secondary | ICD-10-CM | POA: Diagnosis not present

## 2020-01-20 DIAGNOSIS — Z9181 History of falling: Secondary | ICD-10-CM | POA: Diagnosis not present

## 2020-01-20 DIAGNOSIS — Z7982 Long term (current) use of aspirin: Secondary | ICD-10-CM | POA: Diagnosis not present

## 2020-01-20 DIAGNOSIS — Z96651 Presence of right artificial knee joint: Secondary | ICD-10-CM | POA: Diagnosis not present

## 2020-01-20 DIAGNOSIS — G4733 Obstructive sleep apnea (adult) (pediatric): Secondary | ICD-10-CM | POA: Diagnosis not present

## 2020-01-20 DIAGNOSIS — Z87891 Personal history of nicotine dependence: Secondary | ICD-10-CM | POA: Diagnosis not present

## 2020-01-20 DIAGNOSIS — F329 Major depressive disorder, single episode, unspecified: Secondary | ICD-10-CM | POA: Diagnosis not present

## 2020-01-20 DIAGNOSIS — T84013D Broken internal left knee prosthesis, subsequent encounter: Secondary | ICD-10-CM | POA: Diagnosis not present

## 2020-01-20 DIAGNOSIS — T84033D Mechanical loosening of internal left knee prosthetic joint, subsequent encounter: Secondary | ICD-10-CM | POA: Diagnosis not present

## 2020-01-20 DIAGNOSIS — I10 Essential (primary) hypertension: Secondary | ICD-10-CM | POA: Diagnosis not present

## 2020-01-21 DIAGNOSIS — Z96652 Presence of left artificial knee joint: Secondary | ICD-10-CM | POA: Diagnosis not present

## 2020-01-22 DIAGNOSIS — A419 Sepsis, unspecified organism: Secondary | ICD-10-CM | POA: Diagnosis not present

## 2020-01-22 DIAGNOSIS — R61 Generalized hyperhidrosis: Secondary | ICD-10-CM | POA: Diagnosis not present

## 2020-01-22 DIAGNOSIS — Z905 Acquired absence of kidney: Secondary | ICD-10-CM | POA: Diagnosis not present

## 2020-01-22 DIAGNOSIS — R05 Cough: Secondary | ICD-10-CM | POA: Diagnosis not present

## 2020-01-22 DIAGNOSIS — Z20822 Contact with and (suspected) exposure to covid-19: Secondary | ICD-10-CM | POA: Diagnosis not present

## 2020-01-22 DIAGNOSIS — D72829 Elevated white blood cell count, unspecified: Secondary | ICD-10-CM | POA: Diagnosis not present

## 2020-01-22 DIAGNOSIS — I2699 Other pulmonary embolism without acute cor pulmonale: Secondary | ICD-10-CM | POA: Diagnosis not present

## 2020-01-22 DIAGNOSIS — F329 Major depressive disorder, single episode, unspecified: Secondary | ICD-10-CM | POA: Diagnosis not present

## 2020-01-22 DIAGNOSIS — R0602 Shortness of breath: Secondary | ICD-10-CM | POA: Diagnosis not present

## 2020-01-22 DIAGNOSIS — J302 Other seasonal allergic rhinitis: Secondary | ICD-10-CM | POA: Diagnosis not present

## 2020-01-22 DIAGNOSIS — R509 Fever, unspecified: Secondary | ICD-10-CM | POA: Diagnosis not present

## 2020-01-22 DIAGNOSIS — F419 Anxiety disorder, unspecified: Secondary | ICD-10-CM | POA: Diagnosis not present

## 2020-01-22 DIAGNOSIS — Z8249 Family history of ischemic heart disease and other diseases of the circulatory system: Secondary | ICD-10-CM | POA: Diagnosis not present

## 2020-01-22 DIAGNOSIS — I517 Cardiomegaly: Secondary | ICD-10-CM | POA: Diagnosis not present

## 2020-01-22 DIAGNOSIS — Z9989 Dependence on other enabling machines and devices: Secondary | ICD-10-CM | POA: Diagnosis not present

## 2020-01-22 DIAGNOSIS — F1020 Alcohol dependence, uncomplicated: Secondary | ICD-10-CM | POA: Diagnosis not present

## 2020-01-22 DIAGNOSIS — G4733 Obstructive sleep apnea (adult) (pediatric): Secondary | ICD-10-CM | POA: Diagnosis not present

## 2020-01-22 DIAGNOSIS — I2693 Single subsegmental pulmonary embolism without acute cor pulmonale: Secondary | ICD-10-CM | POA: Diagnosis not present

## 2020-01-22 DIAGNOSIS — I2510 Atherosclerotic heart disease of native coronary artery without angina pectoris: Secondary | ICD-10-CM | POA: Diagnosis not present

## 2020-01-22 DIAGNOSIS — T84093D Other mechanical complication of internal left knee prosthesis, subsequent encounter: Secondary | ICD-10-CM | POA: Diagnosis not present

## 2020-01-22 DIAGNOSIS — Z209 Contact with and (suspected) exposure to unspecified communicable disease: Secondary | ICD-10-CM | POA: Diagnosis not present

## 2020-01-22 DIAGNOSIS — Z820 Family history of epilepsy and other diseases of the nervous system: Secondary | ICD-10-CM | POA: Diagnosis not present

## 2020-01-22 DIAGNOSIS — J9601 Acute respiratory failure with hypoxia: Secondary | ICD-10-CM | POA: Diagnosis not present

## 2020-01-22 DIAGNOSIS — J9811 Atelectasis: Secondary | ICD-10-CM | POA: Diagnosis not present

## 2020-01-22 DIAGNOSIS — D62 Acute posthemorrhagic anemia: Secondary | ICD-10-CM | POA: Diagnosis not present

## 2020-01-22 DIAGNOSIS — M96840 Postprocedural hematoma of a musculoskeletal structure following a musculoskeletal system procedure: Secondary | ICD-10-CM | POA: Diagnosis not present

## 2020-01-23 DIAGNOSIS — I2699 Other pulmonary embolism without acute cor pulmonale: Secondary | ICD-10-CM | POA: Insufficient documentation

## 2020-01-23 DIAGNOSIS — R053 Chronic cough: Secondary | ICD-10-CM | POA: Insufficient documentation

## 2020-01-23 DIAGNOSIS — D72829 Elevated white blood cell count, unspecified: Secondary | ICD-10-CM | POA: Insufficient documentation

## 2020-01-29 DIAGNOSIS — T84033D Mechanical loosening of internal left knee prosthetic joint, subsequent encounter: Secondary | ICD-10-CM | POA: Diagnosis not present

## 2020-01-29 DIAGNOSIS — F329 Major depressive disorder, single episode, unspecified: Secondary | ICD-10-CM | POA: Diagnosis not present

## 2020-01-29 DIAGNOSIS — Z7982 Long term (current) use of aspirin: Secondary | ICD-10-CM | POA: Diagnosis not present

## 2020-01-29 DIAGNOSIS — T84013D Broken internal left knee prosthesis, subsequent encounter: Secondary | ICD-10-CM | POA: Diagnosis not present

## 2020-01-29 DIAGNOSIS — Z87891 Personal history of nicotine dependence: Secondary | ICD-10-CM | POA: Diagnosis not present

## 2020-01-29 DIAGNOSIS — Z9981 Dependence on supplemental oxygen: Secondary | ICD-10-CM | POA: Diagnosis not present

## 2020-01-29 DIAGNOSIS — Z96651 Presence of right artificial knee joint: Secondary | ICD-10-CM | POA: Diagnosis not present

## 2020-01-29 DIAGNOSIS — G4733 Obstructive sleep apnea (adult) (pediatric): Secondary | ICD-10-CM | POA: Diagnosis not present

## 2020-01-29 DIAGNOSIS — Z9181 History of falling: Secondary | ICD-10-CM | POA: Diagnosis not present

## 2020-01-29 DIAGNOSIS — I10 Essential (primary) hypertension: Secondary | ICD-10-CM | POA: Diagnosis not present

## 2020-02-01 DIAGNOSIS — Z9181 History of falling: Secondary | ICD-10-CM | POA: Diagnosis not present

## 2020-02-01 DIAGNOSIS — Z87891 Personal history of nicotine dependence: Secondary | ICD-10-CM | POA: Diagnosis not present

## 2020-02-01 DIAGNOSIS — F329 Major depressive disorder, single episode, unspecified: Secondary | ICD-10-CM | POA: Diagnosis not present

## 2020-02-01 DIAGNOSIS — Z7982 Long term (current) use of aspirin: Secondary | ICD-10-CM | POA: Diagnosis not present

## 2020-02-01 DIAGNOSIS — I10 Essential (primary) hypertension: Secondary | ICD-10-CM | POA: Diagnosis not present

## 2020-02-01 DIAGNOSIS — T84033D Mechanical loosening of internal left knee prosthetic joint, subsequent encounter: Secondary | ICD-10-CM | POA: Diagnosis not present

## 2020-02-01 DIAGNOSIS — Z96651 Presence of right artificial knee joint: Secondary | ICD-10-CM | POA: Diagnosis not present

## 2020-02-01 DIAGNOSIS — Z9981 Dependence on supplemental oxygen: Secondary | ICD-10-CM | POA: Diagnosis not present

## 2020-02-01 DIAGNOSIS — T84013D Broken internal left knee prosthesis, subsequent encounter: Secondary | ICD-10-CM | POA: Diagnosis not present

## 2020-02-01 DIAGNOSIS — G4733 Obstructive sleep apnea (adult) (pediatric): Secondary | ICD-10-CM | POA: Diagnosis not present

## 2020-02-03 DIAGNOSIS — Z9981 Dependence on supplemental oxygen: Secondary | ICD-10-CM | POA: Diagnosis not present

## 2020-02-03 DIAGNOSIS — Z96651 Presence of right artificial knee joint: Secondary | ICD-10-CM | POA: Diagnosis not present

## 2020-02-03 DIAGNOSIS — Z87891 Personal history of nicotine dependence: Secondary | ICD-10-CM | POA: Diagnosis not present

## 2020-02-03 DIAGNOSIS — T84013D Broken internal left knee prosthesis, subsequent encounter: Secondary | ICD-10-CM | POA: Diagnosis not present

## 2020-02-03 DIAGNOSIS — Z96652 Presence of left artificial knee joint: Secondary | ICD-10-CM | POA: Diagnosis not present

## 2020-02-03 DIAGNOSIS — Z7982 Long term (current) use of aspirin: Secondary | ICD-10-CM | POA: Diagnosis not present

## 2020-02-03 DIAGNOSIS — T84038A Mechanical loosening of other internal prosthetic joint, initial encounter: Secondary | ICD-10-CM | POA: Diagnosis not present

## 2020-02-03 DIAGNOSIS — I10 Essential (primary) hypertension: Secondary | ICD-10-CM | POA: Diagnosis not present

## 2020-02-03 DIAGNOSIS — F329 Major depressive disorder, single episode, unspecified: Secondary | ICD-10-CM | POA: Diagnosis not present

## 2020-02-03 DIAGNOSIS — T84093D Other mechanical complication of internal left knee prosthesis, subsequent encounter: Secondary | ICD-10-CM | POA: Diagnosis not present

## 2020-02-03 DIAGNOSIS — D72829 Elevated white blood cell count, unspecified: Secondary | ICD-10-CM | POA: Diagnosis not present

## 2020-02-03 DIAGNOSIS — Z9181 History of falling: Secondary | ICD-10-CM | POA: Diagnosis not present

## 2020-02-03 DIAGNOSIS — G4733 Obstructive sleep apnea (adult) (pediatric): Secondary | ICD-10-CM | POA: Diagnosis not present

## 2020-02-03 DIAGNOSIS — T84033D Mechanical loosening of internal left knee prosthetic joint, subsequent encounter: Secondary | ICD-10-CM | POA: Diagnosis not present

## 2020-02-05 DIAGNOSIS — Z87891 Personal history of nicotine dependence: Secondary | ICD-10-CM | POA: Diagnosis not present

## 2020-02-05 DIAGNOSIS — T84033D Mechanical loosening of internal left knee prosthetic joint, subsequent encounter: Secondary | ICD-10-CM | POA: Diagnosis not present

## 2020-02-05 DIAGNOSIS — T84013D Broken internal left knee prosthesis, subsequent encounter: Secondary | ICD-10-CM | POA: Diagnosis not present

## 2020-02-05 DIAGNOSIS — Z7982 Long term (current) use of aspirin: Secondary | ICD-10-CM | POA: Diagnosis not present

## 2020-02-05 DIAGNOSIS — I10 Essential (primary) hypertension: Secondary | ICD-10-CM | POA: Diagnosis not present

## 2020-02-05 DIAGNOSIS — Z9181 History of falling: Secondary | ICD-10-CM | POA: Diagnosis not present

## 2020-02-05 DIAGNOSIS — Z9981 Dependence on supplemental oxygen: Secondary | ICD-10-CM | POA: Diagnosis not present

## 2020-02-05 DIAGNOSIS — G4733 Obstructive sleep apnea (adult) (pediatric): Secondary | ICD-10-CM | POA: Diagnosis not present

## 2020-02-05 DIAGNOSIS — Z96651 Presence of right artificial knee joint: Secondary | ICD-10-CM | POA: Diagnosis not present

## 2020-02-05 DIAGNOSIS — F329 Major depressive disorder, single episode, unspecified: Secondary | ICD-10-CM | POA: Diagnosis not present

## 2020-02-08 DIAGNOSIS — T84033D Mechanical loosening of internal left knee prosthetic joint, subsequent encounter: Secondary | ICD-10-CM | POA: Diagnosis not present

## 2020-02-08 DIAGNOSIS — Z86711 Personal history of pulmonary embolism: Secondary | ICD-10-CM | POA: Diagnosis not present

## 2020-02-08 DIAGNOSIS — Z96651 Presence of right artificial knee joint: Secondary | ICD-10-CM | POA: Diagnosis not present

## 2020-02-08 DIAGNOSIS — Z9181 History of falling: Secondary | ICD-10-CM | POA: Diagnosis not present

## 2020-02-08 DIAGNOSIS — G4733 Obstructive sleep apnea (adult) (pediatric): Secondary | ICD-10-CM | POA: Diagnosis not present

## 2020-02-08 DIAGNOSIS — I10 Essential (primary) hypertension: Secondary | ICD-10-CM | POA: Diagnosis not present

## 2020-02-08 DIAGNOSIS — Z9981 Dependence on supplemental oxygen: Secondary | ICD-10-CM | POA: Diagnosis not present

## 2020-02-08 DIAGNOSIS — F329 Major depressive disorder, single episode, unspecified: Secondary | ICD-10-CM | POA: Diagnosis not present

## 2020-02-08 DIAGNOSIS — Z7982 Long term (current) use of aspirin: Secondary | ICD-10-CM | POA: Diagnosis not present

## 2020-02-08 DIAGNOSIS — Z87891 Personal history of nicotine dependence: Secondary | ICD-10-CM | POA: Diagnosis not present

## 2020-02-08 DIAGNOSIS — M25562 Pain in left knee: Secondary | ICD-10-CM | POA: Diagnosis not present

## 2020-02-08 DIAGNOSIS — G8929 Other chronic pain: Secondary | ICD-10-CM | POA: Diagnosis not present

## 2020-02-08 DIAGNOSIS — T84013D Broken internal left knee prosthesis, subsequent encounter: Secondary | ICD-10-CM | POA: Diagnosis not present

## 2020-02-10 DIAGNOSIS — G4733 Obstructive sleep apnea (adult) (pediatric): Secondary | ICD-10-CM | POA: Diagnosis not present

## 2020-02-10 DIAGNOSIS — I10 Essential (primary) hypertension: Secondary | ICD-10-CM | POA: Diagnosis not present

## 2020-02-10 DIAGNOSIS — Z9981 Dependence on supplemental oxygen: Secondary | ICD-10-CM | POA: Diagnosis not present

## 2020-02-10 DIAGNOSIS — T84013D Broken internal left knee prosthesis, subsequent encounter: Secondary | ICD-10-CM | POA: Diagnosis not present

## 2020-02-10 DIAGNOSIS — Z87891 Personal history of nicotine dependence: Secondary | ICD-10-CM | POA: Diagnosis not present

## 2020-02-10 DIAGNOSIS — T84033D Mechanical loosening of internal left knee prosthetic joint, subsequent encounter: Secondary | ICD-10-CM | POA: Diagnosis not present

## 2020-02-10 DIAGNOSIS — Z7982 Long term (current) use of aspirin: Secondary | ICD-10-CM | POA: Diagnosis not present

## 2020-02-10 DIAGNOSIS — F329 Major depressive disorder, single episode, unspecified: Secondary | ICD-10-CM | POA: Diagnosis not present

## 2020-02-10 DIAGNOSIS — Z96651 Presence of right artificial knee joint: Secondary | ICD-10-CM | POA: Diagnosis not present

## 2020-02-10 DIAGNOSIS — Z9181 History of falling: Secondary | ICD-10-CM | POA: Diagnosis not present

## 2020-02-12 DIAGNOSIS — Z9181 History of falling: Secondary | ICD-10-CM | POA: Diagnosis not present

## 2020-02-12 DIAGNOSIS — Z96651 Presence of right artificial knee joint: Secondary | ICD-10-CM | POA: Diagnosis not present

## 2020-02-12 DIAGNOSIS — Z87891 Personal history of nicotine dependence: Secondary | ICD-10-CM | POA: Diagnosis not present

## 2020-02-12 DIAGNOSIS — T84033D Mechanical loosening of internal left knee prosthetic joint, subsequent encounter: Secondary | ICD-10-CM | POA: Diagnosis not present

## 2020-02-12 DIAGNOSIS — T84013D Broken internal left knee prosthesis, subsequent encounter: Secondary | ICD-10-CM | POA: Diagnosis not present

## 2020-02-12 DIAGNOSIS — G4733 Obstructive sleep apnea (adult) (pediatric): Secondary | ICD-10-CM | POA: Diagnosis not present

## 2020-02-12 DIAGNOSIS — Z7982 Long term (current) use of aspirin: Secondary | ICD-10-CM | POA: Diagnosis not present

## 2020-02-12 DIAGNOSIS — F329 Major depressive disorder, single episode, unspecified: Secondary | ICD-10-CM | POA: Diagnosis not present

## 2020-02-12 DIAGNOSIS — I10 Essential (primary) hypertension: Secondary | ICD-10-CM | POA: Diagnosis not present

## 2020-02-12 DIAGNOSIS — Z9981 Dependence on supplemental oxygen: Secondary | ICD-10-CM | POA: Diagnosis not present

## 2020-02-16 DIAGNOSIS — T84013D Broken internal left knee prosthesis, subsequent encounter: Secondary | ICD-10-CM | POA: Diagnosis not present

## 2020-02-16 DIAGNOSIS — Z7982 Long term (current) use of aspirin: Secondary | ICD-10-CM | POA: Diagnosis not present

## 2020-02-16 DIAGNOSIS — G4733 Obstructive sleep apnea (adult) (pediatric): Secondary | ICD-10-CM | POA: Diagnosis not present

## 2020-02-16 DIAGNOSIS — Z96651 Presence of right artificial knee joint: Secondary | ICD-10-CM | POA: Diagnosis not present

## 2020-02-16 DIAGNOSIS — Z9981 Dependence on supplemental oxygen: Secondary | ICD-10-CM | POA: Diagnosis not present

## 2020-02-16 DIAGNOSIS — I10 Essential (primary) hypertension: Secondary | ICD-10-CM | POA: Diagnosis not present

## 2020-02-16 DIAGNOSIS — F329 Major depressive disorder, single episode, unspecified: Secondary | ICD-10-CM | POA: Diagnosis not present

## 2020-02-16 DIAGNOSIS — Z87891 Personal history of nicotine dependence: Secondary | ICD-10-CM | POA: Diagnosis not present

## 2020-02-16 DIAGNOSIS — T84033D Mechanical loosening of internal left knee prosthetic joint, subsequent encounter: Secondary | ICD-10-CM | POA: Diagnosis not present

## 2020-02-16 DIAGNOSIS — Z9181 History of falling: Secondary | ICD-10-CM | POA: Diagnosis not present

## 2020-02-17 DIAGNOSIS — Z7982 Long term (current) use of aspirin: Secondary | ICD-10-CM | POA: Diagnosis not present

## 2020-02-17 DIAGNOSIS — Z96651 Presence of right artificial knee joint: Secondary | ICD-10-CM | POA: Diagnosis not present

## 2020-02-17 DIAGNOSIS — Z9981 Dependence on supplemental oxygen: Secondary | ICD-10-CM | POA: Diagnosis not present

## 2020-02-17 DIAGNOSIS — I10 Essential (primary) hypertension: Secondary | ICD-10-CM | POA: Diagnosis not present

## 2020-02-17 DIAGNOSIS — T84033D Mechanical loosening of internal left knee prosthetic joint, subsequent encounter: Secondary | ICD-10-CM | POA: Diagnosis not present

## 2020-02-17 DIAGNOSIS — T84013D Broken internal left knee prosthesis, subsequent encounter: Secondary | ICD-10-CM | POA: Diagnosis not present

## 2020-02-17 DIAGNOSIS — G4733 Obstructive sleep apnea (adult) (pediatric): Secondary | ICD-10-CM | POA: Diagnosis not present

## 2020-02-17 DIAGNOSIS — Z9181 History of falling: Secondary | ICD-10-CM | POA: Diagnosis not present

## 2020-02-17 DIAGNOSIS — F329 Major depressive disorder, single episode, unspecified: Secondary | ICD-10-CM | POA: Diagnosis not present

## 2020-02-17 DIAGNOSIS — Z87891 Personal history of nicotine dependence: Secondary | ICD-10-CM | POA: Diagnosis not present

## 2020-02-19 DIAGNOSIS — F329 Major depressive disorder, single episode, unspecified: Secondary | ICD-10-CM | POA: Diagnosis not present

## 2020-02-19 DIAGNOSIS — G4733 Obstructive sleep apnea (adult) (pediatric): Secondary | ICD-10-CM | POA: Diagnosis not present

## 2020-02-19 DIAGNOSIS — Z87891 Personal history of nicotine dependence: Secondary | ICD-10-CM | POA: Diagnosis not present

## 2020-02-19 DIAGNOSIS — T84013D Broken internal left knee prosthesis, subsequent encounter: Secondary | ICD-10-CM | POA: Diagnosis not present

## 2020-02-19 DIAGNOSIS — Z96651 Presence of right artificial knee joint: Secondary | ICD-10-CM | POA: Diagnosis not present

## 2020-02-19 DIAGNOSIS — I10 Essential (primary) hypertension: Secondary | ICD-10-CM | POA: Diagnosis not present

## 2020-02-19 DIAGNOSIS — T84033D Mechanical loosening of internal left knee prosthetic joint, subsequent encounter: Secondary | ICD-10-CM | POA: Diagnosis not present

## 2020-02-19 DIAGNOSIS — Z9981 Dependence on supplemental oxygen: Secondary | ICD-10-CM | POA: Diagnosis not present

## 2020-02-19 DIAGNOSIS — Z9181 History of falling: Secondary | ICD-10-CM | POA: Diagnosis not present

## 2020-02-19 DIAGNOSIS — Z7982 Long term (current) use of aspirin: Secondary | ICD-10-CM | POA: Diagnosis not present

## 2020-02-23 DIAGNOSIS — T84013D Broken internal left knee prosthesis, subsequent encounter: Secondary | ICD-10-CM | POA: Diagnosis not present

## 2020-02-23 DIAGNOSIS — F329 Major depressive disorder, single episode, unspecified: Secondary | ICD-10-CM | POA: Diagnosis not present

## 2020-02-23 DIAGNOSIS — Z87891 Personal history of nicotine dependence: Secondary | ICD-10-CM | POA: Diagnosis not present

## 2020-02-23 DIAGNOSIS — G4733 Obstructive sleep apnea (adult) (pediatric): Secondary | ICD-10-CM | POA: Diagnosis not present

## 2020-02-23 DIAGNOSIS — I10 Essential (primary) hypertension: Secondary | ICD-10-CM | POA: Diagnosis not present

## 2020-02-23 DIAGNOSIS — Z7982 Long term (current) use of aspirin: Secondary | ICD-10-CM | POA: Diagnosis not present

## 2020-02-23 DIAGNOSIS — Z96651 Presence of right artificial knee joint: Secondary | ICD-10-CM | POA: Diagnosis not present

## 2020-02-23 DIAGNOSIS — Z9981 Dependence on supplemental oxygen: Secondary | ICD-10-CM | POA: Diagnosis not present

## 2020-02-23 DIAGNOSIS — Z9181 History of falling: Secondary | ICD-10-CM | POA: Diagnosis not present

## 2020-02-23 DIAGNOSIS — T84033D Mechanical loosening of internal left knee prosthetic joint, subsequent encounter: Secondary | ICD-10-CM | POA: Diagnosis not present

## 2020-02-25 DIAGNOSIS — Z9181 History of falling: Secondary | ICD-10-CM | POA: Diagnosis not present

## 2020-02-25 DIAGNOSIS — Z87891 Personal history of nicotine dependence: Secondary | ICD-10-CM | POA: Diagnosis not present

## 2020-02-25 DIAGNOSIS — Z7982 Long term (current) use of aspirin: Secondary | ICD-10-CM | POA: Diagnosis not present

## 2020-02-25 DIAGNOSIS — Z96651 Presence of right artificial knee joint: Secondary | ICD-10-CM | POA: Diagnosis not present

## 2020-02-25 DIAGNOSIS — T84033D Mechanical loosening of internal left knee prosthetic joint, subsequent encounter: Secondary | ICD-10-CM | POA: Diagnosis not present

## 2020-02-25 DIAGNOSIS — G4733 Obstructive sleep apnea (adult) (pediatric): Secondary | ICD-10-CM | POA: Diagnosis not present

## 2020-02-25 DIAGNOSIS — Z96652 Presence of left artificial knee joint: Secondary | ICD-10-CM | POA: Diagnosis not present

## 2020-02-25 DIAGNOSIS — Z9981 Dependence on supplemental oxygen: Secondary | ICD-10-CM | POA: Diagnosis not present

## 2020-02-25 DIAGNOSIS — T84013D Broken internal left knee prosthesis, subsequent encounter: Secondary | ICD-10-CM | POA: Diagnosis not present

## 2020-02-25 DIAGNOSIS — F329 Major depressive disorder, single episode, unspecified: Secondary | ICD-10-CM | POA: Diagnosis not present

## 2020-02-25 DIAGNOSIS — I10 Essential (primary) hypertension: Secondary | ICD-10-CM | POA: Diagnosis not present

## 2020-03-02 DIAGNOSIS — M25562 Pain in left knee: Secondary | ICD-10-CM | POA: Diagnosis not present

## 2020-03-02 DIAGNOSIS — Z4789 Encounter for other orthopedic aftercare: Secondary | ICD-10-CM | POA: Diagnosis not present

## 2020-03-02 DIAGNOSIS — Z96652 Presence of left artificial knee joint: Secondary | ICD-10-CM | POA: Diagnosis not present

## 2020-03-08 DIAGNOSIS — I2699 Other pulmonary embolism without acute cor pulmonale: Secondary | ICD-10-CM | POA: Diagnosis not present

## 2020-03-08 DIAGNOSIS — R739 Hyperglycemia, unspecified: Secondary | ICD-10-CM | POA: Diagnosis not present

## 2020-03-08 DIAGNOSIS — Z96652 Presence of left artificial knee joint: Secondary | ICD-10-CM | POA: Diagnosis not present

## 2020-03-08 DIAGNOSIS — Z4789 Encounter for other orthopedic aftercare: Secondary | ICD-10-CM | POA: Diagnosis not present

## 2020-03-08 DIAGNOSIS — M25562 Pain in left knee: Secondary | ICD-10-CM | POA: Diagnosis not present

## 2020-03-08 DIAGNOSIS — M109 Gout, unspecified: Secondary | ICD-10-CM | POA: Diagnosis not present

## 2020-03-08 DIAGNOSIS — E785 Hyperlipidemia, unspecified: Secondary | ICD-10-CM | POA: Diagnosis not present

## 2020-03-11 DIAGNOSIS — M25562 Pain in left knee: Secondary | ICD-10-CM | POA: Diagnosis not present

## 2020-03-11 DIAGNOSIS — Z4789 Encounter for other orthopedic aftercare: Secondary | ICD-10-CM | POA: Diagnosis not present

## 2020-03-11 DIAGNOSIS — Z96652 Presence of left artificial knee joint: Secondary | ICD-10-CM | POA: Diagnosis not present

## 2020-03-15 DIAGNOSIS — M25562 Pain in left knee: Secondary | ICD-10-CM | POA: Diagnosis not present

## 2020-03-15 DIAGNOSIS — Z96652 Presence of left artificial knee joint: Secondary | ICD-10-CM | POA: Diagnosis not present

## 2020-03-15 DIAGNOSIS — Z4789 Encounter for other orthopedic aftercare: Secondary | ICD-10-CM | POA: Diagnosis not present

## 2020-03-17 DIAGNOSIS — Z4789 Encounter for other orthopedic aftercare: Secondary | ICD-10-CM | POA: Diagnosis not present

## 2020-03-17 DIAGNOSIS — M25562 Pain in left knee: Secondary | ICD-10-CM | POA: Diagnosis not present

## 2020-03-17 DIAGNOSIS — Z96652 Presence of left artificial knee joint: Secondary | ICD-10-CM | POA: Diagnosis not present

## 2020-03-22 DIAGNOSIS — Z4789 Encounter for other orthopedic aftercare: Secondary | ICD-10-CM | POA: Diagnosis not present

## 2020-03-22 DIAGNOSIS — Z96652 Presence of left artificial knee joint: Secondary | ICD-10-CM | POA: Diagnosis not present

## 2020-03-22 DIAGNOSIS — M25562 Pain in left knee: Secondary | ICD-10-CM | POA: Diagnosis not present

## 2020-03-29 DIAGNOSIS — Z23 Encounter for immunization: Secondary | ICD-10-CM | POA: Diagnosis not present

## 2020-03-29 DIAGNOSIS — Z96652 Presence of left artificial knee joint: Secondary | ICD-10-CM | POA: Diagnosis not present

## 2020-03-29 DIAGNOSIS — Z4789 Encounter for other orthopedic aftercare: Secondary | ICD-10-CM | POA: Diagnosis not present

## 2020-03-29 DIAGNOSIS — M25562 Pain in left knee: Secondary | ICD-10-CM | POA: Diagnosis not present

## 2020-03-30 DIAGNOSIS — I2699 Other pulmonary embolism without acute cor pulmonale: Secondary | ICD-10-CM | POA: Diagnosis not present

## 2020-03-30 DIAGNOSIS — Z96652 Presence of left artificial knee joint: Secondary | ICD-10-CM | POA: Diagnosis not present

## 2020-03-30 DIAGNOSIS — Z87891 Personal history of nicotine dependence: Secondary | ICD-10-CM | POA: Diagnosis not present

## 2020-03-30 DIAGNOSIS — Z7901 Long term (current) use of anticoagulants: Secondary | ICD-10-CM | POA: Diagnosis not present

## 2020-04-01 DIAGNOSIS — Z4789 Encounter for other orthopedic aftercare: Secondary | ICD-10-CM | POA: Diagnosis not present

## 2020-04-01 DIAGNOSIS — Z96652 Presence of left artificial knee joint: Secondary | ICD-10-CM | POA: Diagnosis not present

## 2020-04-01 DIAGNOSIS — M25562 Pain in left knee: Secondary | ICD-10-CM | POA: Diagnosis not present

## 2020-04-25 DIAGNOSIS — Z79899 Other long term (current) drug therapy: Secondary | ICD-10-CM | POA: Diagnosis not present

## 2020-04-25 DIAGNOSIS — Z79890 Hormone replacement therapy (postmenopausal): Secondary | ICD-10-CM | POA: Diagnosis not present

## 2020-04-25 DIAGNOSIS — I2699 Other pulmonary embolism without acute cor pulmonale: Secondary | ICD-10-CM | POA: Diagnosis not present

## 2020-04-25 DIAGNOSIS — Z7901 Long term (current) use of anticoagulants: Secondary | ICD-10-CM | POA: Diagnosis not present

## 2020-04-25 DIAGNOSIS — Z87891 Personal history of nicotine dependence: Secondary | ICD-10-CM | POA: Diagnosis not present

## 2020-05-19 DIAGNOSIS — M5416 Radiculopathy, lumbar region: Secondary | ICD-10-CM | POA: Diagnosis not present

## 2020-05-19 DIAGNOSIS — Z96652 Presence of left artificial knee joint: Secondary | ICD-10-CM | POA: Diagnosis not present

## 2020-05-23 DIAGNOSIS — M5126 Other intervertebral disc displacement, lumbar region: Secondary | ICD-10-CM | POA: Diagnosis not present

## 2020-05-23 DIAGNOSIS — M519 Unspecified thoracic, thoracolumbar and lumbosacral intervertebral disc disorder: Secondary | ICD-10-CM | POA: Diagnosis not present

## 2020-05-27 DIAGNOSIS — M5116 Intervertebral disc disorders with radiculopathy, lumbar region: Secondary | ICD-10-CM | POA: Diagnosis not present

## 2020-05-27 DIAGNOSIS — M5416 Radiculopathy, lumbar region: Secondary | ICD-10-CM | POA: Diagnosis not present

## 2020-05-27 DIAGNOSIS — M4726 Other spondylosis with radiculopathy, lumbar region: Secondary | ICD-10-CM | POA: Diagnosis not present

## 2020-05-27 DIAGNOSIS — M4727 Other spondylosis with radiculopathy, lumbosacral region: Secondary | ICD-10-CM | POA: Diagnosis not present

## 2020-05-27 DIAGNOSIS — M5117 Intervertebral disc disorders with radiculopathy, lumbosacral region: Secondary | ICD-10-CM | POA: Diagnosis not present

## 2020-05-31 DIAGNOSIS — M792 Neuralgia and neuritis, unspecified: Secondary | ICD-10-CM | POA: Diagnosis not present

## 2020-05-31 DIAGNOSIS — M5416 Radiculopathy, lumbar region: Secondary | ICD-10-CM | POA: Diagnosis not present

## 2020-06-09 DIAGNOSIS — B372 Candidiasis of skin and nail: Secondary | ICD-10-CM | POA: Diagnosis not present

## 2020-06-11 HISTORY — PX: LUMBAR LAMINECTOMY: SHX95

## 2020-06-13 DIAGNOSIS — M519 Unspecified thoracic, thoracolumbar and lumbosacral intervertebral disc disorder: Secondary | ICD-10-CM | POA: Diagnosis not present

## 2020-06-15 DIAGNOSIS — Z96652 Presence of left artificial knee joint: Secondary | ICD-10-CM | POA: Diagnosis not present

## 2020-06-15 DIAGNOSIS — M7652 Patellar tendinitis, left knee: Secondary | ICD-10-CM | POA: Diagnosis not present

## 2020-06-15 DIAGNOSIS — T84033A Mechanical loosening of internal left knee prosthetic joint, initial encounter: Secondary | ICD-10-CM | POA: Diagnosis not present

## 2020-06-21 DIAGNOSIS — M5416 Radiculopathy, lumbar region: Secondary | ICD-10-CM | POA: Diagnosis not present

## 2020-07-01 DIAGNOSIS — M47816 Spondylosis without myelopathy or radiculopathy, lumbar region: Secondary | ICD-10-CM | POA: Diagnosis not present

## 2020-07-01 DIAGNOSIS — M5126 Other intervertebral disc displacement, lumbar region: Secondary | ICD-10-CM | POA: Diagnosis not present

## 2020-07-01 DIAGNOSIS — M5416 Radiculopathy, lumbar region: Secondary | ICD-10-CM | POA: Diagnosis not present

## 2020-07-01 DIAGNOSIS — M5441 Lumbago with sciatica, right side: Secondary | ICD-10-CM | POA: Diagnosis not present

## 2020-07-12 DIAGNOSIS — M5416 Radiculopathy, lumbar region: Secondary | ICD-10-CM | POA: Diagnosis not present

## 2020-07-12 DIAGNOSIS — M545 Low back pain, unspecified: Secondary | ICD-10-CM | POA: Diagnosis not present

## 2020-07-19 DIAGNOSIS — M545 Low back pain, unspecified: Secondary | ICD-10-CM | POA: Diagnosis not present

## 2020-07-19 DIAGNOSIS — M5416 Radiculopathy, lumbar region: Secondary | ICD-10-CM | POA: Diagnosis not present

## 2020-07-21 DIAGNOSIS — M545 Low back pain, unspecified: Secondary | ICD-10-CM | POA: Diagnosis not present

## 2020-07-21 DIAGNOSIS — M5416 Radiculopathy, lumbar region: Secondary | ICD-10-CM | POA: Diagnosis not present

## 2020-07-25 DIAGNOSIS — G4733 Obstructive sleep apnea (adult) (pediatric): Secondary | ICD-10-CM | POA: Diagnosis not present

## 2020-07-25 DIAGNOSIS — Z79899 Other long term (current) drug therapy: Secondary | ICD-10-CM | POA: Diagnosis not present

## 2020-07-25 DIAGNOSIS — Z7901 Long term (current) use of anticoagulants: Secondary | ICD-10-CM | POA: Diagnosis not present

## 2020-07-25 DIAGNOSIS — Z86711 Personal history of pulmonary embolism: Secondary | ICD-10-CM | POA: Diagnosis not present

## 2020-07-25 DIAGNOSIS — M549 Dorsalgia, unspecified: Secondary | ICD-10-CM | POA: Diagnosis not present

## 2020-07-25 DIAGNOSIS — Z9989 Dependence on other enabling machines and devices: Secondary | ICD-10-CM | POA: Diagnosis not present

## 2020-07-25 DIAGNOSIS — I2699 Other pulmonary embolism without acute cor pulmonale: Secondary | ICD-10-CM | POA: Diagnosis not present

## 2020-07-25 DIAGNOSIS — M1990 Unspecified osteoarthritis, unspecified site: Secondary | ICD-10-CM | POA: Diagnosis not present

## 2020-07-25 DIAGNOSIS — Z79890 Hormone replacement therapy (postmenopausal): Secondary | ICD-10-CM | POA: Diagnosis not present

## 2020-07-25 DIAGNOSIS — Z87891 Personal history of nicotine dependence: Secondary | ICD-10-CM | POA: Diagnosis not present

## 2020-07-25 DIAGNOSIS — M5416 Radiculopathy, lumbar region: Secondary | ICD-10-CM | POA: Diagnosis not present

## 2020-07-25 DIAGNOSIS — I10 Essential (primary) hypertension: Secondary | ICD-10-CM | POA: Diagnosis not present

## 2020-07-25 DIAGNOSIS — Z09 Encounter for follow-up examination after completed treatment for conditions other than malignant neoplasm: Secondary | ICD-10-CM | POA: Diagnosis not present

## 2020-07-28 DIAGNOSIS — Z96652 Presence of left artificial knee joint: Secondary | ICD-10-CM | POA: Diagnosis not present

## 2020-07-28 DIAGNOSIS — I2699 Other pulmonary embolism without acute cor pulmonale: Secondary | ICD-10-CM | POA: Diagnosis not present

## 2020-08-01 DIAGNOSIS — M48062 Spinal stenosis, lumbar region with neurogenic claudication: Secondary | ICD-10-CM | POA: Diagnosis not present

## 2020-08-01 DIAGNOSIS — M47816 Spondylosis without myelopathy or radiculopathy, lumbar region: Secondary | ICD-10-CM | POA: Diagnosis not present

## 2020-08-01 DIAGNOSIS — M5136 Other intervertebral disc degeneration, lumbar region: Secondary | ICD-10-CM | POA: Diagnosis not present

## 2020-08-01 DIAGNOSIS — M5416 Radiculopathy, lumbar region: Secondary | ICD-10-CM | POA: Diagnosis not present

## 2020-08-09 DIAGNOSIS — Z22321 Carrier or suspected carrier of Methicillin susceptible Staphylococcus aureus: Secondary | ICD-10-CM | POA: Diagnosis not present

## 2020-08-09 DIAGNOSIS — M4726 Other spondylosis with radiculopathy, lumbar region: Secondary | ICD-10-CM | POA: Diagnosis not present

## 2020-08-09 DIAGNOSIS — Z01818 Encounter for other preprocedural examination: Secondary | ICD-10-CM | POA: Diagnosis not present

## 2020-08-09 DIAGNOSIS — Z01810 Encounter for preprocedural cardiovascular examination: Secondary | ICD-10-CM | POA: Diagnosis not present

## 2020-08-09 DIAGNOSIS — Z01812 Encounter for preprocedural laboratory examination: Secondary | ICD-10-CM | POA: Diagnosis not present

## 2020-08-09 DIAGNOSIS — M48062 Spinal stenosis, lumbar region with neurogenic claudication: Secondary | ICD-10-CM | POA: Diagnosis not present

## 2020-08-14 DIAGNOSIS — Z20822 Contact with and (suspected) exposure to covid-19: Secondary | ICD-10-CM | POA: Diagnosis not present

## 2020-08-14 DIAGNOSIS — Z01812 Encounter for preprocedural laboratory examination: Secondary | ICD-10-CM | POA: Diagnosis not present

## 2020-09-15 DIAGNOSIS — M5416 Radiculopathy, lumbar region: Secondary | ICD-10-CM | POA: Diagnosis not present

## 2020-09-15 DIAGNOSIS — Z01818 Encounter for other preprocedural examination: Secondary | ICD-10-CM | POA: Diagnosis not present

## 2020-09-15 DIAGNOSIS — I10 Essential (primary) hypertension: Secondary | ICD-10-CM | POA: Diagnosis not present

## 2020-09-15 DIAGNOSIS — Z01812 Encounter for preprocedural laboratory examination: Secondary | ICD-10-CM | POA: Diagnosis not present

## 2020-09-15 DIAGNOSIS — K219 Gastro-esophageal reflux disease without esophagitis: Secondary | ICD-10-CM | POA: Diagnosis not present

## 2020-09-15 DIAGNOSIS — E785 Hyperlipidemia, unspecified: Secondary | ICD-10-CM | POA: Diagnosis not present

## 2020-09-27 DIAGNOSIS — Z96653 Presence of artificial knee joint, bilateral: Secondary | ICD-10-CM | POA: Diagnosis not present

## 2020-09-27 DIAGNOSIS — Z79899 Other long term (current) drug therapy: Secondary | ICD-10-CM | POA: Diagnosis not present

## 2020-09-27 DIAGNOSIS — G4733 Obstructive sleep apnea (adult) (pediatric): Secondary | ICD-10-CM | POA: Diagnosis not present

## 2020-09-27 DIAGNOSIS — Z888 Allergy status to other drugs, medicaments and biological substances status: Secondary | ICD-10-CM | POA: Diagnosis not present

## 2020-09-27 DIAGNOSIS — Z87891 Personal history of nicotine dependence: Secondary | ICD-10-CM | POA: Diagnosis not present

## 2020-09-27 DIAGNOSIS — M4726 Other spondylosis with radiculopathy, lumbar region: Secondary | ICD-10-CM | POA: Diagnosis not present

## 2020-09-27 DIAGNOSIS — F32A Depression, unspecified: Secondary | ICD-10-CM | POA: Diagnosis not present

## 2020-09-27 DIAGNOSIS — M5416 Radiculopathy, lumbar region: Secondary | ICD-10-CM | POA: Diagnosis not present

## 2020-09-27 DIAGNOSIS — Z885 Allergy status to narcotic agent status: Secondary | ICD-10-CM | POA: Diagnosis not present

## 2020-09-27 DIAGNOSIS — M48061 Spinal stenosis, lumbar region without neurogenic claudication: Secondary | ICD-10-CM | POA: Diagnosis not present

## 2020-09-27 DIAGNOSIS — M1990 Unspecified osteoarthritis, unspecified site: Secondary | ICD-10-CM | POA: Diagnosis not present

## 2020-09-27 DIAGNOSIS — Z86711 Personal history of pulmonary embolism: Secondary | ICD-10-CM | POA: Diagnosis not present

## 2020-09-27 DIAGNOSIS — Z880 Allergy status to penicillin: Secondary | ICD-10-CM | POA: Diagnosis not present

## 2020-09-27 DIAGNOSIS — M47816 Spondylosis without myelopathy or radiculopathy, lumbar region: Secondary | ICD-10-CM | POA: Diagnosis not present

## 2020-09-27 DIAGNOSIS — Z803 Family history of malignant neoplasm of breast: Secondary | ICD-10-CM | POA: Diagnosis not present

## 2020-09-27 DIAGNOSIS — Z7901 Long term (current) use of anticoagulants: Secondary | ICD-10-CM | POA: Diagnosis not present

## 2020-09-27 DIAGNOSIS — Z800 Family history of malignant neoplasm of digestive organs: Secondary | ICD-10-CM | POA: Diagnosis not present

## 2020-09-27 DIAGNOSIS — I10 Essential (primary) hypertension: Secondary | ICD-10-CM | POA: Diagnosis not present

## 2020-10-01 DIAGNOSIS — Z20822 Contact with and (suspected) exposure to covid-19: Secondary | ICD-10-CM | POA: Diagnosis not present

## 2020-10-01 DIAGNOSIS — Z87891 Personal history of nicotine dependence: Secondary | ICD-10-CM | POA: Diagnosis not present

## 2020-10-01 DIAGNOSIS — R079 Chest pain, unspecified: Secondary | ICD-10-CM | POA: Diagnosis not present

## 2020-10-01 DIAGNOSIS — R059 Cough, unspecified: Secondary | ICD-10-CM | POA: Diagnosis not present

## 2020-10-01 DIAGNOSIS — Z9889 Other specified postprocedural states: Secondary | ICD-10-CM | POA: Diagnosis not present

## 2020-10-01 DIAGNOSIS — Z880 Allergy status to penicillin: Secondary | ICD-10-CM | POA: Diagnosis not present

## 2020-10-01 DIAGNOSIS — M1990 Unspecified osteoarthritis, unspecified site: Secondary | ICD-10-CM | POA: Diagnosis not present

## 2020-10-01 DIAGNOSIS — R7989 Other specified abnormal findings of blood chemistry: Secondary | ICD-10-CM | POA: Diagnosis not present

## 2020-10-01 DIAGNOSIS — I10 Essential (primary) hypertension: Secondary | ICD-10-CM | POA: Diagnosis not present

## 2020-10-01 DIAGNOSIS — R0602 Shortness of breath: Secondary | ICD-10-CM | POA: Diagnosis not present

## 2020-10-01 DIAGNOSIS — Z7901 Long term (current) use of anticoagulants: Secondary | ICD-10-CM | POA: Diagnosis not present

## 2020-10-01 DIAGNOSIS — F32A Depression, unspecified: Secondary | ICD-10-CM | POA: Diagnosis not present

## 2020-10-01 DIAGNOSIS — Z79899 Other long term (current) drug therapy: Secondary | ICD-10-CM | POA: Diagnosis not present

## 2020-10-01 DIAGNOSIS — Z791 Long term (current) use of non-steroidal anti-inflammatories (NSAID): Secondary | ICD-10-CM | POA: Diagnosis not present

## 2020-10-01 DIAGNOSIS — R509 Fever, unspecified: Secondary | ICD-10-CM | POA: Diagnosis not present

## 2020-10-01 DIAGNOSIS — Z96653 Presence of artificial knee joint, bilateral: Secondary | ICD-10-CM | POA: Diagnosis not present

## 2020-10-01 DIAGNOSIS — G8918 Other acute postprocedural pain: Secondary | ICD-10-CM | POA: Diagnosis not present

## 2020-10-01 DIAGNOSIS — Z885 Allergy status to narcotic agent status: Secondary | ICD-10-CM | POA: Diagnosis not present

## 2020-10-01 DIAGNOSIS — Z86711 Personal history of pulmonary embolism: Secondary | ICD-10-CM | POA: Diagnosis not present

## 2020-10-01 DIAGNOSIS — Z888 Allergy status to other drugs, medicaments and biological substances status: Secondary | ICD-10-CM | POA: Diagnosis not present

## 2020-10-01 DIAGNOSIS — R0600 Dyspnea, unspecified: Secondary | ICD-10-CM | POA: Diagnosis not present

## 2020-10-24 DIAGNOSIS — R899 Unspecified abnormal finding in specimens from other organs, systems and tissues: Secondary | ICD-10-CM | POA: Diagnosis not present

## 2020-10-24 DIAGNOSIS — M549 Dorsalgia, unspecified: Secondary | ICD-10-CM | POA: Diagnosis not present

## 2020-10-24 DIAGNOSIS — I2699 Other pulmonary embolism without acute cor pulmonale: Secondary | ICD-10-CM | POA: Diagnosis not present

## 2020-10-24 DIAGNOSIS — Z7901 Long term (current) use of anticoagulants: Secondary | ICD-10-CM | POA: Diagnosis not present

## 2020-11-01 DIAGNOSIS — Z7901 Long term (current) use of anticoagulants: Secondary | ICD-10-CM | POA: Diagnosis not present

## 2020-11-01 DIAGNOSIS — M549 Dorsalgia, unspecified: Secondary | ICD-10-CM | POA: Diagnosis not present

## 2020-11-01 DIAGNOSIS — I2699 Other pulmonary embolism without acute cor pulmonale: Secondary | ICD-10-CM | POA: Diagnosis not present

## 2020-11-01 DIAGNOSIS — R899 Unspecified abnormal finding in specimens from other organs, systems and tissues: Secondary | ICD-10-CM | POA: Diagnosis not present

## 2020-11-01 DIAGNOSIS — K429 Umbilical hernia without obstruction or gangrene: Secondary | ICD-10-CM | POA: Diagnosis not present

## 2020-11-01 DIAGNOSIS — N4289 Other specified disorders of prostate: Secondary | ICD-10-CM | POA: Diagnosis not present

## 2020-11-01 DIAGNOSIS — I700 Atherosclerosis of aorta: Secondary | ICD-10-CM | POA: Diagnosis not present

## 2020-11-08 DIAGNOSIS — N281 Cyst of kidney, acquired: Secondary | ICD-10-CM | POA: Diagnosis not present

## 2020-11-08 DIAGNOSIS — N2889 Other specified disorders of kidney and ureter: Secondary | ICD-10-CM | POA: Diagnosis not present

## 2020-11-10 DIAGNOSIS — R791 Abnormal coagulation profile: Secondary | ICD-10-CM | POA: Diagnosis not present

## 2020-11-10 DIAGNOSIS — E785 Hyperlipidemia, unspecified: Secondary | ICD-10-CM | POA: Diagnosis not present

## 2020-11-10 DIAGNOSIS — R531 Weakness: Secondary | ICD-10-CM | POA: Diagnosis not present

## 2020-11-10 DIAGNOSIS — R262 Difficulty in walking, not elsewhere classified: Secondary | ICD-10-CM | POA: Diagnosis not present

## 2020-11-10 DIAGNOSIS — Z4789 Encounter for other orthopedic aftercare: Secondary | ICD-10-CM | POA: Diagnosis not present

## 2020-11-10 DIAGNOSIS — M5459 Other low back pain: Secondary | ICD-10-CM | POA: Diagnosis not present

## 2020-11-14 DIAGNOSIS — Z4789 Encounter for other orthopedic aftercare: Secondary | ICD-10-CM | POA: Diagnosis not present

## 2020-11-14 DIAGNOSIS — R262 Difficulty in walking, not elsewhere classified: Secondary | ICD-10-CM | POA: Diagnosis not present

## 2020-11-14 DIAGNOSIS — R531 Weakness: Secondary | ICD-10-CM | POA: Diagnosis not present

## 2020-11-14 DIAGNOSIS — M5459 Other low back pain: Secondary | ICD-10-CM | POA: Diagnosis not present

## 2020-11-16 DIAGNOSIS — R262 Difficulty in walking, not elsewhere classified: Secondary | ICD-10-CM | POA: Diagnosis not present

## 2020-11-16 DIAGNOSIS — R531 Weakness: Secondary | ICD-10-CM | POA: Diagnosis not present

## 2020-11-16 DIAGNOSIS — Z4789 Encounter for other orthopedic aftercare: Secondary | ICD-10-CM | POA: Diagnosis not present

## 2020-11-16 DIAGNOSIS — M5459 Other low back pain: Secondary | ICD-10-CM | POA: Diagnosis not present

## 2020-11-21 DIAGNOSIS — R531 Weakness: Secondary | ICD-10-CM | POA: Diagnosis not present

## 2020-11-21 DIAGNOSIS — M5459 Other low back pain: Secondary | ICD-10-CM | POA: Diagnosis not present

## 2020-11-21 DIAGNOSIS — R262 Difficulty in walking, not elsewhere classified: Secondary | ICD-10-CM | POA: Diagnosis not present

## 2020-11-21 DIAGNOSIS — Z4789 Encounter for other orthopedic aftercare: Secondary | ICD-10-CM | POA: Diagnosis not present

## 2020-11-23 DIAGNOSIS — R262 Difficulty in walking, not elsewhere classified: Secondary | ICD-10-CM | POA: Diagnosis not present

## 2020-11-23 DIAGNOSIS — Z4789 Encounter for other orthopedic aftercare: Secondary | ICD-10-CM | POA: Diagnosis not present

## 2020-11-23 DIAGNOSIS — M5459 Other low back pain: Secondary | ICD-10-CM | POA: Diagnosis not present

## 2020-11-23 DIAGNOSIS — R531 Weakness: Secondary | ICD-10-CM | POA: Diagnosis not present

## 2020-11-28 DIAGNOSIS — M5459 Other low back pain: Secondary | ICD-10-CM | POA: Diagnosis not present

## 2020-11-28 DIAGNOSIS — Z4789 Encounter for other orthopedic aftercare: Secondary | ICD-10-CM | POA: Diagnosis not present

## 2020-11-28 DIAGNOSIS — R531 Weakness: Secondary | ICD-10-CM | POA: Diagnosis not present

## 2020-11-28 DIAGNOSIS — R262 Difficulty in walking, not elsewhere classified: Secondary | ICD-10-CM | POA: Diagnosis not present

## 2020-12-05 DIAGNOSIS — R531 Weakness: Secondary | ICD-10-CM | POA: Diagnosis not present

## 2020-12-05 DIAGNOSIS — Z4789 Encounter for other orthopedic aftercare: Secondary | ICD-10-CM | POA: Diagnosis not present

## 2020-12-05 DIAGNOSIS — R262 Difficulty in walking, not elsewhere classified: Secondary | ICD-10-CM | POA: Diagnosis not present

## 2020-12-05 DIAGNOSIS — M5459 Other low back pain: Secondary | ICD-10-CM | POA: Diagnosis not present

## 2020-12-07 DIAGNOSIS — R262 Difficulty in walking, not elsewhere classified: Secondary | ICD-10-CM | POA: Diagnosis not present

## 2020-12-07 DIAGNOSIS — Z4789 Encounter for other orthopedic aftercare: Secondary | ICD-10-CM | POA: Diagnosis not present

## 2020-12-07 DIAGNOSIS — M5459 Other low back pain: Secondary | ICD-10-CM | POA: Diagnosis not present

## 2020-12-07 DIAGNOSIS — R531 Weakness: Secondary | ICD-10-CM | POA: Diagnosis not present

## 2020-12-14 DIAGNOSIS — Z4789 Encounter for other orthopedic aftercare: Secondary | ICD-10-CM | POA: Diagnosis not present

## 2020-12-14 DIAGNOSIS — R262 Difficulty in walking, not elsewhere classified: Secondary | ICD-10-CM | POA: Diagnosis not present

## 2020-12-14 DIAGNOSIS — M5459 Other low back pain: Secondary | ICD-10-CM | POA: Diagnosis not present

## 2020-12-14 DIAGNOSIS — R531 Weakness: Secondary | ICD-10-CM | POA: Diagnosis not present

## 2020-12-16 ENCOUNTER — Other Ambulatory Visit: Payer: Self-pay

## 2020-12-16 ENCOUNTER — Inpatient Hospital Stay: Payer: BC Managed Care – PPO | Attending: Hematology & Oncology

## 2020-12-16 ENCOUNTER — Telehealth: Payer: Self-pay | Admitting: *Deleted

## 2020-12-16 ENCOUNTER — Other Ambulatory Visit: Payer: Self-pay | Admitting: Family

## 2020-12-16 ENCOUNTER — Encounter: Payer: Self-pay | Admitting: Family

## 2020-12-16 ENCOUNTER — Inpatient Hospital Stay (HOSPITAL_BASED_OUTPATIENT_CLINIC_OR_DEPARTMENT_OTHER): Payer: BC Managed Care – PPO | Admitting: Family

## 2020-12-16 VITALS — BP 149/79 | HR 58 | Temp 98.0°F | Resp 18 | Ht 74.0 in | Wt 261.1 lb

## 2020-12-16 DIAGNOSIS — Z79890 Hormone replacement therapy (postmenopausal): Secondary | ICD-10-CM | POA: Insufficient documentation

## 2020-12-16 DIAGNOSIS — Z87891 Personal history of nicotine dependence: Secondary | ICD-10-CM

## 2020-12-16 DIAGNOSIS — I2699 Other pulmonary embolism without acute cor pulmonale: Secondary | ICD-10-CM

## 2020-12-16 DIAGNOSIS — I824Y9 Acute embolism and thrombosis of unspecified deep veins of unspecified proximal lower extremity: Secondary | ICD-10-CM

## 2020-12-16 DIAGNOSIS — D6859 Other primary thrombophilia: Secondary | ICD-10-CM

## 2020-12-16 DIAGNOSIS — Z7901 Long term (current) use of anticoagulants: Secondary | ICD-10-CM

## 2020-12-16 DIAGNOSIS — Z86711 Personal history of pulmonary embolism: Secondary | ICD-10-CM

## 2020-12-16 LAB — CMP (CANCER CENTER ONLY)
ALT: 30 U/L (ref 0–44)
AST: 26 U/L (ref 15–41)
Albumin: 4.5 g/dL (ref 3.5–5.0)
Alkaline Phosphatase: 116 U/L (ref 38–126)
Anion gap: 8 (ref 5–15)
BUN: 17 mg/dL (ref 8–23)
CO2: 27 mmol/L (ref 22–32)
Calcium: 10.1 mg/dL (ref 8.9–10.3)
Chloride: 107 mmol/L (ref 98–111)
Creatinine: 0.99 mg/dL (ref 0.61–1.24)
GFR, Estimated: >60 mL/min (ref >60)
Glucose, Bld: 113 mg/dL — ABNORMAL HIGH (ref 70–99)
Potassium: 4 mmol/L (ref 3.5–5.1)
Sodium: 142 mmol/L (ref 135–145)
Total Bilirubin: 0.6 mg/dL (ref 0.3–1.2)
Total Protein: 6.9 g/dL (ref 6.5–8.1)

## 2020-12-16 LAB — CBC WITH DIFFERENTIAL (CANCER CENTER ONLY)
Abs Immature Granulocytes: 0.02 10*3/uL (ref 0.00–0.07)
Basophils Absolute: 0.1 10*3/uL (ref 0.0–0.1)
Basophils Relative: 1 %
Eosinophils Absolute: 0.3 10*3/uL (ref 0.0–0.5)
Eosinophils Relative: 4 %
HCT: 40 % (ref 39.0–52.0)
Hemoglobin: 13 g/dL (ref 13.0–17.0)
Immature Granulocytes: 0 %
Lymphocytes Relative: 33 %
Lymphs Abs: 2.2 10*3/uL (ref 0.7–4.0)
MCH: 28.6 pg (ref 26.0–34.0)
MCHC: 32.5 g/dL (ref 30.0–36.0)
MCV: 88.1 fL (ref 80.0–100.0)
Monocytes Absolute: 0.5 10*3/uL (ref 0.1–1.0)
Monocytes Relative: 7 %
Neutro Abs: 3.7 10*3/uL (ref 1.7–7.7)
Neutrophils Relative %: 55 %
Platelet Count: 220 10*3/uL (ref 150–400)
RBC: 4.54 MIL/uL (ref 4.22–5.81)
RDW: 14.4 % (ref 11.5–15.5)
WBC Count: 6.6 10*3/uL (ref 4.0–10.5)
nRBC: 0 % (ref 0.0–0.2)

## 2020-12-16 LAB — ANTITHROMBIN III: AntiThromb III Func: 117 % (ref 75–120)

## 2020-12-16 LAB — D-DIMER, QUANTITATIVE: D-Dimer, Quant: 3.43 ug/mL-FEU — ABNORMAL HIGH (ref 0.00–0.50)

## 2020-12-16 LAB — LACTATE DEHYDROGENASE: LDH: 194 U/L — ABNORMAL HIGH (ref 98–192)

## 2020-12-16 MED ORDER — RIVAROXABAN 10 MG PO TABS: 10.0000 mg | ORAL_TABLET | Freq: Every day | ORAL | 5 refills | Status: DC

## 2020-12-16 NOTE — Addendum Note (Signed)
Addended by: Eliezer Bottom on: 12/16/2020 04:25 PM   Modules accepted: Level of Service

## 2020-12-16 NOTE — Progress Notes (Signed)
Hematology/Oncology Consultation   Name: Tony Frye      MRN: [id number]    Location: Room/bed info not found  Date: 12/16/2020 Time:8:57 AM   REFERRING PHYSICIAN:  Carolee Rota, NP  REASON FOR CONSULT:  Abnormal coagulation profile, elevated D-Dimer    DIAGNOSIS: Elevated D-Dimer and history of pulmonary embolism of right lower lobe  HISTORY OF PRESENT ILLNESS: Tony Frye is a very pleasant 60 yo caucasian gentleman with history of PE of the right lower lobe (diagnosed on 01/23/2020) post left knee replacement surgery on 01/12/2020.  He has no prior history or family history of thrombotic event.  His D-dimer was previously elevated at 7 and today is down to 3.43.  He is currently on Xarelto 20 mg PO daily and will complete one year of full dose in August 2022.  He notes fatigue at times and takes a break to rest as needed.  He was previously on Testosterone injections every 3 weeks. He stopped these in April prior to his back surgery and has not restarted. He does feel better when he is on them but understands that he will have to commit to lifelong anticoagulation if he continues per MD We recommend he stay off any kind of testosterone supplementation as this significantly increases his risk for recurrent thrombotic event.   He has chronic lower back and right leg pain with sciatica post lumbar laminectomy on 09/27/2020.  No personal history of cancer. Mother had colon and breast cancer.  He states that he is up to date on his colonoscopy  No fever, chills, n/v, cough, rash, dizziness, SOB, chest pain, palpitations, abdominal pain or changes in bowel or bladder habits.  He has occasional bright red blood in stool with straining and constipation.  He has tingling in his fingertips that comes and goes with history of carpal tunnel.  He states that since having surgery on the left knee he has some slight swelling  No falls or syncope to report.  No smoking, ETOH or recreational drug use. He  quit smoking in 2002.  He has maintained a good appetite and is staying well hydrated. His weight is stable at 261 lbs.  He states that he has applied for disability due to his back issues.  ROS: All other 10 point review of systems is negative.   PAST MEDICAL HISTORY:   Past Medical History:  Diagnosis Date   Cancer ([place])    squamous cell, face   Depressed    Hiatal hernia    Low testosterone    OSA on CPAP    Osteoarthritis    both knees   Reflux esophagitis     ALLERGIES: Allergies  Allergen Reactions   Codeine Hives   Nexium [Esomeprazole Magnesium]    Penicillins    Morphine And Related Rash      MEDICATIONS:  Current Outpatient Medications on File Prior to Visit  Medication Sig Dispense Refill   escitalopram (LEXAPRO) 20 MG tablet Take 20 mg by mouth daily.     glucosamine-chondroitin 500-400 MG tablet Take 2 tablets by mouth daily.     Magnesium Oxide 500 MG CAPS Take by mouth daily.     modafinil (PROVIGIL) 100 MG tablet Take 100 mg by mouth daily.     montelukast (SINGULAIR) 10 MG tablet Take 10 mg by mouth at bedtime.     Multiple Vitamin (MULTI VITAMIN DAILY PO) Take by mouth daily.     Omega-3 Fatty Acids (FISH OIL) 1000 MG CAPS Take  by mouth daily.     pantoprazole (PROTONIX) 40 MG tablet Take 40 mg by mouth daily.     Testosterone Cypionate 200 MG/ML KIT Inject into the muscle. Every 3-4 weeks     No current facility-administered medications on file prior to visit.     PAST SURGICAL HISTORY Past Surgical History:  Procedure Laterality Date   KNEE ARTHROSCOPY Right    partial ampu     REPLACEMENT TOTAL KNEE Left     FAMILY HISTORY: Family History  Problem Relation Age of Onset   Cancer - Colon Mother    Cancer - Other Mother    Heart disease Father    Hypertension Maternal Grandfather     SOCIAL HISTORY:  reports that he has quit smoking. He does not have any smokeless tobacco history on file. No history on file for alcohol use and drug  use.  PERFORMANCE STATUS: The patient's performance status is 1 - Symptomatic but completely ambulatory  PHYSICAL EXAM: Most Recent Vital Signs: There were no vitals taken for this visit. There were no vitals taken for this visit.  General Appearance:    Alert, cooperative, no distress, appears stated age  Head:    Normocephalic, without obvious abnormality, atraumatic  Eyes:    PERRL, conjunctiva/corneas clear, EOM's intact, fundi    benign, both eyes             Throat:   Lips, mucosa, and tongue normal; teeth and gums normal  Neck:   Supple, symmetrical, trachea midline, no adenopathy;       thyroid:  No enlargement/tenderness/nodules; no carotid   bruit or JVD  Back:     Symmetric, no curvature, ROM normal, no CVA tenderness  Lungs:     Clear to auscultation bilaterally, respirations unlabored  Chest wall:    No tenderness or deformity  Heart:    Regular rate and rhythm, S1 and S2 normal, no murmur, rub   or gallop  Abdomen:     Soft, non-tender, bowel sounds active all four quadrants,    no masses, no organomegaly        Extremities:   Extremities normal, atraumatic, no cyanosis or edema  Pulses:   2+ and symmetric all extremities  Skin:   Skin color, texture, turgor normal, no rashes or lesions  Lymph nodes:   Cervical, supraclavicular, and axillary nodes normal  Neurologic:   CNII-XII intact. Normal strength, sensation and reflexes      throughout    LABORATORY DATA:  Results for orders placed or performed in visit on 12/16/20 (from the past 48 hour(s))  CBC with Differential ([place] Only)     Status: None   Collection Time: 12/16/20  8:45 AM  Result Value Ref Range   WBC Count 6.6 4.0 - 10.5 K/uL   RBC 4.54 4.22 - 5.81 MIL/uL   Hemoglobin 13.0 13.0 - 17.0 g/dL   HCT 40.0 39.0 - 52.0 %   MCV 88.1 80.0 - 100.0 fL   MCH 28.6 26.0 - 34.0 pg   MCHC 32.5 30.0 - 36.0 g/dL   RDW 14.4 11.5 - 15.5 %   Platelet Count 220 150 - 400 K/uL   nRBC 0.0 0.0 - 0.2 %    Neutrophils Relative % 55 %   Neutro Abs 3.7 1.7 - 7.7 K/uL   Lymphocytes Relative 33 %   Lymphs Abs 2.2 0.7 - 4.0 K/uL   Monocytes Relative 7 %   Monocytes Absolute 0.5 0.1 - 1.0 K/uL  Eosinophils Relative 4 %   Eosinophils Absolute 0.3 0.0 - 0.5 K/uL   Basophils Relative 1 %   Basophils Absolute 0.1 0.0 - 0.1 K/uL   Immature Granulocytes 0 %   Abs Immature Granulocytes 0.02 0.00 - 0.07 K/uL    Comment: Performed at [facility] at [facility], [address] [place], [place] [address]      RADIOGRAPHY: No results found.     PATHOLOGY: None  ASSESSMENT/PLAN:  Mr. Wittler is a very pleasant 59 yo caucasian gentleman with history of PE of the right lower lobe (diagnosed on 01/23/2020) post left knee replacement surgery on 01/12/2020. He had been on testosterone injections every 3 weeks at that time as well. He has since stopped these.  He is tolerating Xarelto nicely and will change to maintenance dosing 10 mg PO daily in August.  Hyper coag panel is pending to assess for any underlying genetic risks for clotting.  We will plan to follow-up in another 6 months.  All questions were answered and he is in agreement with the plan. The patient knows to call the clinic with any problems, questions or concerns. We can certainly see the patient much sooner if necessary.  The patient was discussed with Dr. Marin Olp and he is in agreement with the aforementioned.   Laverna Peace, NP

## 2020-12-16 NOTE — Telephone Encounter (Signed)
Per 12/16/20 los gave patient upcoming appointment - patient confirmed

## 2020-12-17 LAB — PROTEIN S, TOTAL: Protein S Ag, Total: 77 % (ref 60–150)

## 2020-12-17 LAB — LUPUS ANTICOAGULANT PANEL
DRVVT: 98.2 s — ABNORMAL HIGH (ref 0.0–47.0)
PTT Lupus Anticoagulant: 42.8 s (ref 0.0–51.9)

## 2020-12-17 LAB — PROTEIN C ACTIVITY: Protein C Activity: 136 % (ref 73–180)

## 2020-12-17 LAB — HOMOCYSTEINE: Homocysteine: 9.9 umol/L (ref 0.0–17.2)

## 2020-12-17 LAB — PROTEIN S ACTIVITY: Protein S Activity: 131 % (ref 63–140)

## 2020-12-17 LAB — DRVVT MIX: dRVVT Mix: 73.4 s — ABNORMAL HIGH (ref 0.0–40.4)

## 2020-12-17 LAB — DRVVT CONFIRM: dRVVT Confirm: 1.6 ratio — ABNORMAL HIGH (ref 0.8–1.2)

## 2020-12-18 LAB — CARDIOLIPIN ANTIBODIES, IGG, IGM, IGA
Anticardiolipin IgA: <9 APL U/mL (ref 0–11)
Anticardiolipin IgG: <9 GPL U/mL (ref 0–14)
Anticardiolipin IgM: 9 MPL U/mL (ref 0–12)

## 2020-12-18 LAB — BETA-2-GLYCOPROTEIN I ABS, IGG/M/A
Beta-2 Glyco I IgG: <9 GPI IgG units (ref 0–20)
Beta-2-Glycoprotein I IgA: <9 GPI IgA units (ref 0–25)
Beta-2-Glycoprotein I IgM: <9 GPI IgM units (ref 0–32)

## 2020-12-18 LAB — PROTEIN C, TOTAL: Protein C, Total: 144 % (ref 60–150)

## 2020-12-20 DIAGNOSIS — M48062 Spinal stenosis, lumbar region with neurogenic claudication: Secondary | ICD-10-CM | POA: Diagnosis not present

## 2020-12-20 DIAGNOSIS — M5137 Other intervertebral disc degeneration, lumbosacral region: Secondary | ICD-10-CM | POA: Diagnosis not present

## 2020-12-20 DIAGNOSIS — M5136 Other intervertebral disc degeneration, lumbar region: Secondary | ICD-10-CM | POA: Diagnosis not present

## 2020-12-22 LAB — FACTOR 5 LEIDEN

## 2020-12-22 LAB — PROTHROMBIN GENE MUTATION

## 2020-12-23 ENCOUNTER — Telehealth: Payer: Self-pay | Admitting: Family

## 2020-12-23 NOTE — Telephone Encounter (Signed)
I was able to leave a message on his personal cell phone with call back number to discuss recent lab work.

## 2020-12-26 ENCOUNTER — Telehealth: Payer: Self-pay | Admitting: Family

## 2020-12-26 ENCOUNTER — Other Ambulatory Visit: Payer: Self-pay | Admitting: Family

## 2020-12-26 DIAGNOSIS — G5600 Carpal tunnel syndrome, unspecified upper limb: Secondary | ICD-10-CM | POA: Diagnosis not present

## 2020-12-26 DIAGNOSIS — I2699 Other pulmonary embolism without acute cor pulmonale: Secondary | ICD-10-CM

## 2020-12-26 DIAGNOSIS — R350 Frequency of micturition: Secondary | ICD-10-CM | POA: Diagnosis not present

## 2020-12-26 DIAGNOSIS — I824Y9 Acute embolism and thrombosis of unspecified deep veins of unspecified proximal lower extremity: Secondary | ICD-10-CM

## 2020-12-26 DIAGNOSIS — D6859 Other primary thrombophilia: Secondary | ICD-10-CM

## 2020-12-26 DIAGNOSIS — F3340 Major depressive disorder, recurrent, in remission, unspecified: Secondary | ICD-10-CM | POA: Diagnosis not present

## 2020-12-26 NOTE — Telephone Encounter (Signed)
I was able to speak with Tony Frye and go over recent hypercoag panel results. He was positive for the lupus anticoagulant but the rest of his panel was unremarkable. He did restart his testosterone injections and states that he really needs this to function. He will continue on his Xarelto and drop to maintenance dose of 10 mg PO daily in August likely long term with his testosterone use. Patient verbalized understanding. No questions at this time. Patient appreciative of call.

## 2021-01-02 DIAGNOSIS — M5136 Other intervertebral disc degeneration, lumbar region: Secondary | ICD-10-CM | POA: Diagnosis not present

## 2021-01-02 DIAGNOSIS — M48062 Spinal stenosis, lumbar region with neurogenic claudication: Secondary | ICD-10-CM | POA: Diagnosis not present

## 2021-01-02 DIAGNOSIS — M5126 Other intervertebral disc displacement, lumbar region: Secondary | ICD-10-CM | POA: Diagnosis not present

## 2021-01-02 DIAGNOSIS — M5127 Other intervertebral disc displacement, lumbosacral region: Secondary | ICD-10-CM | POA: Diagnosis not present

## 2021-01-09 DIAGNOSIS — M5416 Radiculopathy, lumbar region: Secondary | ICD-10-CM | POA: Diagnosis not present

## 2021-01-09 DIAGNOSIS — M545 Low back pain, unspecified: Secondary | ICD-10-CM | POA: Diagnosis not present

## 2021-01-10 ENCOUNTER — Other Ambulatory Visit: Payer: Self-pay | Admitting: Orthopedic Surgery

## 2021-01-10 ENCOUNTER — Other Ambulatory Visit: Payer: Self-pay | Admitting: Family

## 2021-01-10 DIAGNOSIS — G8929 Other chronic pain: Secondary | ICD-10-CM

## 2021-01-10 DIAGNOSIS — M545 Low back pain, unspecified: Secondary | ICD-10-CM

## 2021-01-13 ENCOUNTER — Ambulatory Visit
Admission: RE | Admit: 2021-01-13 | Discharge: 2021-01-13 | Disposition: A | Discharge status: Home / Self Care | Payer: BC Managed Care – PPO | Source: Ambulatory Visit | Attending: Orthopedic Surgery | Admitting: Orthopedic Surgery

## 2021-01-13 ENCOUNTER — Other Ambulatory Visit: Payer: Self-pay

## 2021-01-13 DIAGNOSIS — G8929 Other chronic pain: Secondary | ICD-10-CM

## 2021-01-13 DIAGNOSIS — M47817 Spondylosis without myelopathy or radiculopathy, lumbosacral region: Secondary | ICD-10-CM | POA: Diagnosis not present

## 2021-01-13 MED ORDER — IOPAMIDOL (ISOVUE-M 200) INJECTION 41%
1.0000 mL | Freq: Once | INTRAMUSCULAR | Status: AC
  Administered 2021-01-13: 1 mL via EPIDURAL

## 2021-01-13 MED ORDER — METHYLPREDNISOLONE ACETATE 40 MG/ML INJ SUSP (RADIOLOG
80.0000 mg | Freq: Once | INTRAMUSCULAR | Status: AC
  Administered 2021-01-13: 80 mg via EPIDURAL

## 2021-01-13 NOTE — Discharge Instructions (Signed)
Post Procedure Spinal Discharge Instruction Sheet  You may resume a regular diet and any medications that you routinely take (including pain medications) unless otherwise noted by MD.  No driving day of procedure.  Light activity throughout the rest of the day.  Do not do any strenuous work, exercise, bending or lifting.  The day following the procedure, you can resume normal physical activity but you should refrain from exercising or physical therapy for at least three days thereafter.  You may apply ice to the injection site, 20 minutes on, 20 minutes off, as needed. Do not apply ice directly to skin.    Common Side Effects:  Headaches- take your usual medications as directed by your physician.  Increase your fluid intake.  Caffeinated beverages may be helpful.  Lie flat in bed until your headache resolves.  Restlessness or inability to sleep- you may have trouble sleeping for the next few days.  Ask your referring physician if you need any medication for sleep.  Facial flushing or redness- should subside within a few days.  Increased pain- a temporary increase in pain a day or two following your procedure is not unusual.  Take your pain medication as prescribed by your referring physician.  Leg cramps  Please contact our office at [phone] for the following symptoms: Fever greater than 100 degrees. Headaches unresolved with medication after 2-3 days. Increased swelling, pain, or redness at injection site.   Thank you for visiting [facility] today.    YOU MAY RESUME YOUR XARELTO 24 HOURS AFTER INJECTION

## 2021-01-26 DIAGNOSIS — Z96652 Presence of left artificial knee joint: Secondary | ICD-10-CM | POA: Diagnosis not present

## 2021-01-27 DIAGNOSIS — M5416 Radiculopathy, lumbar region: Secondary | ICD-10-CM | POA: Diagnosis not present

## 2021-02-14 DIAGNOSIS — G5603 Carpal tunnel syndrome, bilateral upper limbs: Secondary | ICD-10-CM | POA: Diagnosis not present

## 2021-03-06 DIAGNOSIS — M5416 Radiculopathy, lumbar region: Secondary | ICD-10-CM | POA: Diagnosis not present

## 2021-03-27 DIAGNOSIS — R7303 Prediabetes: Secondary | ICD-10-CM | POA: Diagnosis not present

## 2021-03-27 DIAGNOSIS — Z13220 Encounter for screening for lipoid disorders: Secondary | ICD-10-CM | POA: Diagnosis not present

## 2021-03-27 DIAGNOSIS — Z23 Encounter for immunization: Secondary | ICD-10-CM | POA: Diagnosis not present

## 2021-03-27 DIAGNOSIS — Z0000 Encounter for general adult medical examination without abnormal findings: Secondary | ICD-10-CM | POA: Diagnosis not present

## 2021-03-27 DIAGNOSIS — E785 Hyperlipidemia, unspecified: Secondary | ICD-10-CM | POA: Diagnosis not present

## 2021-03-27 DIAGNOSIS — F339 Major depressive disorder, recurrent, unspecified: Secondary | ICD-10-CM | POA: Diagnosis not present

## 2021-03-27 DIAGNOSIS — I10 Essential (primary) hypertension: Secondary | ICD-10-CM | POA: Diagnosis not present

## 2021-03-28 DIAGNOSIS — Z800 Family history of malignant neoplasm of digestive organs: Secondary | ICD-10-CM | POA: Diagnosis not present

## 2021-03-28 DIAGNOSIS — Z86010 Personal history of colonic polyps: Secondary | ICD-10-CM | POA: Diagnosis not present

## 2021-03-28 DIAGNOSIS — K625 Hemorrhage of anus and rectum: Secondary | ICD-10-CM | POA: Diagnosis not present

## 2021-03-28 DIAGNOSIS — K219 Gastro-esophageal reflux disease without esophagitis: Secondary | ICD-10-CM | POA: Diagnosis not present

## 2021-03-29 ENCOUNTER — Other Ambulatory Visit: Payer: Self-pay | Admitting: Orthopedic Surgery

## 2021-03-31 ENCOUNTER — Other Ambulatory Visit: Payer: Self-pay | Admitting: Family

## 2021-03-31 ENCOUNTER — Telehealth: Payer: Self-pay | Admitting: Family

## 2021-03-31 DIAGNOSIS — I824Y9 Acute embolism and thrombosis of unspecified deep veins of unspecified proximal lower extremity: Secondary | ICD-10-CM

## 2021-03-31 DIAGNOSIS — I2699 Other pulmonary embolism without acute cor pulmonale: Secondary | ICD-10-CM

## 2021-03-31 DIAGNOSIS — R760 Raised antibody titer: Secondary | ICD-10-CM

## 2021-03-31 DIAGNOSIS — D6859 Other primary thrombophilia: Secondary | ICD-10-CM

## 2021-03-31 MED ORDER — RIVAROXABAN 20 MG PO TABS: 20.0000 mg | ORAL_TABLET | Freq: Every day | ORAL | 0 refills | Status: DC

## 2021-03-31 NOTE — Telephone Encounter (Signed)
I left a message on patient's personal cell phone regarding Xarelto change to 20 mg PO daily from now until 3 days prior to surgery (04/23/2021) and then hold. He will restart at 10 mg PO daily 2 days after surgery. Requested call back to confirm that he got message and make sure he understands directions.

## 2021-04-03 ENCOUNTER — Telehealth: Payer: Self-pay

## 2021-04-03 NOTE — Telephone Encounter (Signed)
Patient called to confirm his xarelto dose prior to and post surgery. Called patient back and left message confirming to stop xarelto 3 days prior to surgery and restart at 10mg  daily 2 days after surgery. Patient to call back if he still has any questions or concerns.

## 2021-04-11 ENCOUNTER — Other Ambulatory Visit: Payer: Self-pay

## 2021-04-11 DIAGNOSIS — K648 Other hemorrhoids: Secondary | ICD-10-CM | POA: Diagnosis not present

## 2021-04-11 DIAGNOSIS — Z86010 Personal history of colonic polyps: Secondary | ICD-10-CM | POA: Diagnosis not present

## 2021-04-11 DIAGNOSIS — K635 Polyp of colon: Secondary | ICD-10-CM | POA: Diagnosis not present

## 2021-04-11 DIAGNOSIS — K5730 Diverticulosis of large intestine without perforation or abscess without bleeding: Secondary | ICD-10-CM | POA: Diagnosis not present

## 2021-04-11 DIAGNOSIS — Z800 Family history of malignant neoplasm of digestive organs: Secondary | ICD-10-CM | POA: Diagnosis not present

## 2021-04-18 ENCOUNTER — Ambulatory Visit (INDEPENDENT_AMBULATORY_CARE_PROVIDER_SITE_OTHER): Payer: BC Managed Care – PPO | Admitting: Vascular Surgery

## 2021-04-18 ENCOUNTER — Other Ambulatory Visit: Payer: Self-pay

## 2021-04-18 ENCOUNTER — Encounter: Payer: Self-pay | Admitting: Vascular Surgery

## 2021-04-18 DIAGNOSIS — G8929 Other chronic pain: Secondary | ICD-10-CM

## 2021-04-18 DIAGNOSIS — M549 Dorsalgia, unspecified: Secondary | ICD-10-CM | POA: Insufficient documentation

## 2021-04-18 DIAGNOSIS — M545 Low back pain, unspecified: Secondary | ICD-10-CM | POA: Diagnosis not present

## 2021-04-18 NOTE — Progress Notes (Signed)
Patient name: Tony Frye MRN: [id number] DOB: [birth year] Sex: male  REASON FOR CONSULT: Evaluate for L4-L5 and L5-S1 ALIF  HPI: Tony Frye is a 60 y.o. male, with history of osteoarthritis that presents for two-level ALIF at L4-L5 and L5-S1.  Patient describes severe right lower extremity radiculopathy.  He has undergone previous L5-S1 microdiscectomy in 2016 and an L4-L5 decompression in April 2022 at outside hospital.  Sounds like after his last surgery he had severe right leg pain after surgery especially in the foot.  He has been evaluated by Dr. Phylliss Bob with ongoing right-sided lumbar radiculopathy and noted to have disc degeneration and stenosis at L4-L5 and L5-S1.  He presents for evaluation of two-level ALIF.  He denies any previous abdominal surgeries.  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  Past Medical History:  Diagnosis Date   Cancer ([place])    squamous cell, face   Depressed    Hiatal hernia    Low testosterone    OSA on CPAP    Osteoarthritis    both knees   Reflux esophagitis     Past Surgical History:  Procedure Laterality Date   KNEE ARTHROSCOPY Right    partial ampu     REPLACEMENT TOTAL KNEE Left     Family History  Problem Relation Age of Onset   Cancer - Colon Mother    Cancer - Other Mother    Heart disease Father    Hypertension Maternal Grandfather     SOCIAL HISTORY: Social History   Socioeconomic History   Marital status: Married    Spouse name: Not on file   Number of children: Not on file   Years of education: Not on file   Highest education level: Not on file  Occupational History   Not on file  Tobacco Use   Smoking status: Former    Packs/day: 0.50    Types: Cigarettes    Quit date: 2002    Years since quitting: 20.8   Smokeless tobacco: Never  Vaping Use   Vaping Use: Never used  Substance and Sexual Activity   Alcohol use: Not on file   Drug use: Not on file   Sexual activity: Not on file   Other Topics Concern   Not on file  Social History Narrative   Not on file   Social Determinants of Health   Financial Resource Strain: Not on file  Food Insecurity: Not on file  Transportation Needs: Not on file  Physical Activity: Not on file  Stress: Not on file  Social Connections: Not on file  Intimate Partner Violence: Not on file    Allergies  Allergen Reactions   Penicillins Anaphylaxis   Buprenorphine Hcl Rash   Nexium [Esomeprazole Magnesium] Nausea And Vomiting   Codeine Hives and Rash   Morphine And Related Rash    Current Outpatient Medications  Medication Sig Dispense Refill   buPROPion (WELLBUTRIN XL) 300 MG 24 hr tablet Take 300 mg by mouth every morning.     diclofenac (VOLTAREN) 75 MG EC tablet Take 1 tablet by mouth every 3 (three) days.     levocetirizine (XYZAL) 5 MG tablet SMARTSIG:1 Tablet(s) By Mouth Every Evening     lisinopril (ZESTRIL) 10 MG tablet Take by mouth.     pantoprazole (PROTONIX) 40 MG tablet Take 40 mg by mouth daily.     pregabalin (LYRICA) 50 MG capsule Take 50 mg by mouth 3 (three) times daily.  rivaroxaban (XARELTO) 10 MG TABS tablet Take 1 tablet (10 mg total) by mouth daily. 30 tablet 5   rivaroxaban (XARELTO) 20 MG TABS tablet Take 1 tablet (20 mg total) by mouth daily with supper for 23 days. 23 tablet 0   rosuvastatin (CRESTOR) 10 MG tablet Take by mouth.     Testosterone Cypionate 200 MG/ML KIT Inject into the muscle. Every 3-4 weeks     No current facility-administered medications for this visit.    REVIEW OF SYSTEMS:  _0  denotes positive finding, _1  denotes negative finding Cardiac  Comments:  Chest pain or chest pressure:    Shortness of breath upon exertion:    Short of breath when lying flat:    Irregular heart rhythm:        Vascular    Pain in calf, thigh, or hip brought on by ambulation:    Pain in feet at night that wakes you up from your sleep:     Blood clot in your veins:    Leg swelling:          Pulmonary    Oxygen at home:    Productive cough:     Wheezing:         Neurologic    Sudden weakness in arms or legs:     Sudden numbness in arms or legs:     Sudden onset of difficulty speaking or slurred speech:    Temporary loss of vision in one eye:     Problems with dizziness:         Gastrointestinal    Blood in stool:     Vomited blood:         Genitourinary    Burning when urinating:     Blood in urine:        Psychiatric    Major depression:         Hematologic    Bleeding problems:    Problems with blood clotting too easily:        Skin    Rashes or ulcers:        Constitutional    Fever or chills:      PHYSICAL EXAM: Vitals:   04/18/21 0957  BP: 108/72  Pulse: 60  Resp: 18  Temp: 97.7 F (36.5 C)  TempSrc: Temporal  SpO2: 96%  Weight: 256 lb (116.1 kg)  Height: _2  (1.88 m)    GENERAL: The patient is a well-nourished male, in no acute distress. The vital signs are documented above. CARDIAC: There is a regular rate and rhythm.  VASCULAR:  Palpable femoral pulses bilaterally Palpable DP and PT pulses bilaterally PULMONARY: No respiratory distress. ABDOMEN: Soft and non-tender.  No previous incisions.   MUSCULOSKELETAL: There are no major deformities or cyanosis. NEUROLOGIC: No focal weakness or paresthesias are detected. SKIN: There are no ulcers or rashes noted. PSYCHIATRIC: The patient has a normal affect.  DATA:   MRI reviewed from 03/25/2018 and the aortic bifurcation is at the L4 body and the vein bifurcation is at the top of L5.  Assessment/Plan:  60 year old male with chronic right-sided lumbar radiculopathy who presents for preop evaluation of two-level ALIF at L4-L5 and L5-S1.  I have reviewed his recent MRI imaging and discussed he would be a good candidate for anterior approach.  We discussed paramedian incision over the left rectus muscle and then mobilizing the rectus to enter the retroperitoneum and mobilizing peritoneum  and left ureter across midline.  Discussed mobilizing iliac artery  and vein to expose the L4-L5 and L5-S1 disc space from the front.  Discussed risk of injury to the above structures.  Questions answered.  Look forward to assisting Dr. Lynann Bologna.  Marty Heck, MD [facility] Office: [phone]

## 2021-04-21 NOTE — Progress Notes (Addendum)
Surgical Instructions    Your procedure is scheduled on Wednesday, November 16th.  Report to [facility] Main Entrance "A" at 5:30 A.M., then check in with the Admitting office.  Call this number if you have problems the morning of surgery:  [phone]   If you have any questions prior to your surgery date call [phone]: Open Monday-Friday 8am-4pm    Remember:  Do not eat after midnight the night before your surgery  You may drink clear liquids until 5:30 AM the morning of your surgery.   Clear liquids allowed are: Water, Non-Citrus Juices (without pulp), Carbonated Beverages, Clear Tea, Black Coffee ONLY (NO MILK, CREAM OR POWDERED CREAMER of any kind), and Gatorade  Please complete your PRE-SURGERY ENSURE that was provided to you by 5:30 AM the morning of surgery.  Please, if able, drink it in one setting. DO NOT SIP.     Take these medicines the morning of surgery with A SIP OF WATER Bupropion (Wellbutrin) Pantoprazole (Protonix) Pregabalin (Lyrica) Rosuvastatin (Crestor)  If needed: Xyzal   Follow your surgeon's instructions on when to stop Xarelto.  If no instructions were given by your surgeon then you will need to call the office to get those instructions.    As of today, STOP taking any Aspirin (unless otherwise instructed by your surgeon) Aleve, Naproxen, Ibuprofen, Motrin, Advil, Goody's, BC's, all herbal medications, fish oil, and all vitamins.    DAY OF SURGERY Do not wear jewelry Do not wear lotions, powders, colognes, or deodorant. Men may shave face and neck. Do not bring valuables to the hospital.             [facility] is not responsible for any belongings or valuables.  Do NOT Smoke (Tobacco/Vaping)  24 hours prior to your procedure  If you use a CPAP at night, you may bring your mask for your overnight stay.   Contacts, glasses, hearing aids, dentures or partials may not be worn into surgery, please bring cases for these belongings   For  patients admitted to the hospital, discharge time will be determined by your treatment team.   Patients discharged the day of surgery will not be allowed to drive home, and someone needs to stay with them for 24 hours.  NO VISITORS WILL BE ALLOWED IN PRE-OP WHERE PATIENTS ARE PREPPED FOR SURGERY.  ONLY 1 SUPPORT PERSON MAY BE PRESENT IN THE WAITING ROOM WHILE YOU ARE IN SURGERY.  IF YOU ARE TO BE ADMITTED, ONCE YOU ARE IN YOUR ROOM YOU WILL BE ALLOWED TWO (2) VISITORS. 1 (ONE) VISITOR MAY STAY OVERNIGHT BUT MUST ARRIVE TO THE ROOM BY 8pm.  Minor children may have two parents present. Special consideration for safety and communication needs will be reviewed on a case by case basis.  Special instructions:    Oral Hygiene is also important to reduce your risk of infection.  Remember - BRUSH YOUR TEETH THE MORNING OF SURGERY WITH YOUR REGULAR TOOTHPASTE   [place]- Preparing For Surgery  Before surgery, you can play an important role. Because skin is not sterile, your skin needs to be as free of germs as possible. You can reduce the number of germs on your skin by washing with CHG (chlorahexidine gluconate) Soap before surgery.  CHG is an antiseptic cleaner which kills germs and bonds with the skin to continue killing germs even after washing.     Please do not use if you have an allergy to CHG or antibacterial soaps. If your skin  becomes reddened/irritated stop using the CHG.  Do not shave (including legs and underarms) for at least 48 hours prior to first CHG shower. It is OK to shave your face.  Please follow these instructions carefully.     Shower the NIGHT BEFORE SURGERY and the MORNING OF SURGERY with CHG Soap.   If you chose to wash your hair, wash your hair first as usual with your normal shampoo. After you shampoo, rinse your hair and body thoroughly to remove the shampoo.  Then [employer] and genitals (private parts) with your normal soap and rinse thoroughly to remove soap.  After  that Use CHG Soap as you would any other liquid soap. You can apply CHG directly to the skin and wash gently with a scrungie or a clean washcloth.   Apply the CHG Soap to your body ONLY FROM THE NECK DOWN.  Do not use on open wounds or open sores. Avoid contact with your eyes, ears, mouth and genitals (private parts). Wash Face and genitals (private parts)  with your normal soap.   Wash thoroughly, paying special attention to the area where your surgery will be performed.  Thoroughly rinse your body with warm water from the neck down.  DO NOT shower/wash with your normal soap after using and rinsing off the CHG Soap.  Pat yourself dry with a CLEAN TOWEL.  Wear CLEAN PAJAMAS to bed the night before surgery  Place CLEAN SHEETS on your bed the night before your surgery  DO NOT SLEEP WITH PETS.   Day of Surgery:  Take a shower with CHG soap. Wear Clean/Comfortable clothing the morning of surgery Do not apply any deodorants/lotions.   Remember to brush your teeth WITH YOUR REGULAR TOOTHPASTE.   Please read over the following fact sheets that you were given.

## 2021-04-24 ENCOUNTER — Other Ambulatory Visit: Payer: Self-pay

## 2021-04-24 ENCOUNTER — Encounter (HOSPITAL_COMMUNITY)
Admission: RE | Admit: 2021-04-24 | Discharge: 2021-04-24 | Disposition: A | Discharge status: Home / Self Care | Payer: BC Managed Care – PPO | Source: Ambulatory Visit | Attending: Orthopedic Surgery | Admitting: Orthopedic Surgery

## 2021-04-24 ENCOUNTER — Encounter (HOSPITAL_COMMUNITY): Payer: Self-pay

## 2021-04-24 VITALS — BP 114/68 | HR 59 | Temp 97.6°F | Resp 18 | Ht 74.0 in | Wt 258.0 lb

## 2021-04-24 DIAGNOSIS — G4733 Obstructive sleep apnea (adult) (pediatric): Secondary | ICD-10-CM | POA: Diagnosis not present

## 2021-04-24 DIAGNOSIS — Z885 Allergy status to narcotic agent status: Secondary | ICD-10-CM | POA: Diagnosis not present

## 2021-04-24 DIAGNOSIS — Z0389 Encounter for observation for other suspected diseases and conditions ruled out: Secondary | ICD-10-CM | POA: Diagnosis not present

## 2021-04-24 DIAGNOSIS — K2100 Gastro-esophageal reflux disease with esophagitis, without bleeding: Secondary | ICD-10-CM | POA: Diagnosis not present

## 2021-04-24 DIAGNOSIS — M4326 Fusion of spine, lumbar region: Secondary | ICD-10-CM | POA: Diagnosis not present

## 2021-04-24 DIAGNOSIS — Z87891 Personal history of nicotine dependence: Secondary | ICD-10-CM | POA: Diagnosis not present

## 2021-04-24 DIAGNOSIS — M2578 Osteophyte, vertebrae: Secondary | ICD-10-CM | POA: Diagnosis not present

## 2021-04-24 DIAGNOSIS — Z880 Allergy status to penicillin: Secondary | ICD-10-CM | POA: Diagnosis not present

## 2021-04-24 DIAGNOSIS — Z01812 Encounter for preprocedural laboratory examination: Secondary | ICD-10-CM | POA: Insufficient documentation

## 2021-04-24 DIAGNOSIS — G8929 Other chronic pain: Secondary | ICD-10-CM | POA: Diagnosis not present

## 2021-04-24 DIAGNOSIS — Z79899 Other long term (current) drug therapy: Secondary | ICD-10-CM | POA: Diagnosis not present

## 2021-04-24 DIAGNOSIS — Z20822 Contact with and (suspected) exposure to covid-19: Secondary | ICD-10-CM | POA: Diagnosis not present

## 2021-04-24 DIAGNOSIS — Z8249 Family history of ischemic heart disease and other diseases of the circulatory system: Secondary | ICD-10-CM | POA: Diagnosis not present

## 2021-04-24 DIAGNOSIS — M5416 Radiculopathy, lumbar region: Secondary | ICD-10-CM | POA: Diagnosis not present

## 2021-04-24 DIAGNOSIS — Z888 Allergy status to other drugs, medicaments and biological substances status: Secondary | ICD-10-CM | POA: Diagnosis not present

## 2021-04-24 DIAGNOSIS — M545 Low back pain, unspecified: Secondary | ICD-10-CM | POA: Diagnosis not present

## 2021-04-24 DIAGNOSIS — M4807 Spinal stenosis, lumbosacral region: Secondary | ICD-10-CM | POA: Diagnosis not present

## 2021-04-24 DIAGNOSIS — K219 Gastro-esophageal reflux disease without esophagitis: Secondary | ICD-10-CM | POA: Diagnosis not present

## 2021-04-24 DIAGNOSIS — Z9889 Other specified postprocedural states: Secondary | ICD-10-CM | POA: Diagnosis not present

## 2021-04-24 DIAGNOSIS — Z96653 Presence of artificial knee joint, bilateral: Secondary | ICD-10-CM | POA: Diagnosis not present

## 2021-04-24 DIAGNOSIS — M48061 Spinal stenosis, lumbar region without neurogenic claudication: Secondary | ICD-10-CM | POA: Diagnosis not present

## 2021-04-24 DIAGNOSIS — M5116 Intervertebral disc disorders with radiculopathy, lumbar region: Secondary | ICD-10-CM | POA: Diagnosis not present

## 2021-04-24 DIAGNOSIS — R509 Fever, unspecified: Secondary | ICD-10-CM | POA: Diagnosis not present

## 2021-04-24 DIAGNOSIS — J9811 Atelectasis: Secondary | ICD-10-CM | POA: Diagnosis not present

## 2021-04-24 DIAGNOSIS — I10 Essential (primary) hypertension: Secondary | ICD-10-CM | POA: Diagnosis not present

## 2021-04-24 DIAGNOSIS — Z9989 Dependence on other enabling machines and devices: Secondary | ICD-10-CM | POA: Diagnosis not present

## 2021-04-24 DIAGNOSIS — Z7901 Long term (current) use of anticoagulants: Secondary | ICD-10-CM | POA: Diagnosis not present

## 2021-04-24 DIAGNOSIS — M5117 Intervertebral disc disorders with radiculopathy, lumbosacral region: Secondary | ICD-10-CM | POA: Diagnosis not present

## 2021-04-24 DIAGNOSIS — Z01818 Encounter for other preprocedural examination: Secondary | ICD-10-CM

## 2021-04-24 DIAGNOSIS — Z981 Arthrodesis status: Secondary | ICD-10-CM | POA: Diagnosis not present

## 2021-04-24 HISTORY — DX: Essential (primary) hypertension: I10

## 2021-04-24 LAB — TYPE AND SCREEN
ABO/RH(D): O POS
Antibody Screen: NEGATIVE

## 2021-04-24 LAB — PROTIME-INR
INR: 1 (ref 0.8–1.2)
Prothrombin Time: 12.8 seconds (ref 11.4–15.2)

## 2021-04-24 LAB — CBC WITH DIFFERENTIAL/PLATELET
Abs Immature Granulocytes: 0.02 10*3/uL (ref 0.00–0.07)
Basophils Absolute: 0.1 10*3/uL (ref 0.0–0.1)
Basophils Relative: 1 %
Eosinophils Absolute: 0.5 10*3/uL (ref 0.0–0.5)
Eosinophils Relative: 8 %
HCT: 43.6 % (ref 39.0–52.0)
Hemoglobin: 14.3 g/dL (ref 13.0–17.0)
Immature Granulocytes: 0 %
Lymphocytes Relative: 30 %
Lymphs Abs: 1.9 10*3/uL (ref 0.7–4.0)
MCH: 29 pg (ref 26.0–34.0)
MCHC: 32.8 g/dL (ref 30.0–36.0)
MCV: 88.4 fL (ref 80.0–100.0)
Monocytes Absolute: 0.4 10*3/uL (ref 0.1–1.0)
Monocytes Relative: 7 %
Neutro Abs: 3.5 10*3/uL (ref 1.7–7.7)
Neutrophils Relative %: 54 %
Platelets: 219 10*3/uL (ref 150–400)
RBC: 4.93 MIL/uL (ref 4.22–5.81)
RDW: 14.2 % (ref 11.5–15.5)
WBC: 6.4 10*3/uL (ref 4.0–10.5)
nRBC: 0 % (ref 0.0–0.2)

## 2021-04-24 LAB — URINALYSIS, ROUTINE W REFLEX MICROSCOPIC
Bilirubin Urine: NEGATIVE
Glucose, UA: NEGATIVE mg/dL
Hgb urine dipstick: NEGATIVE
Ketones, ur: NEGATIVE mg/dL
Leukocytes,Ua: NEGATIVE
Nitrite: NEGATIVE
Protein, ur: NEGATIVE mg/dL
Specific Gravity, Urine: 1.01 (ref 1.005–1.030)
pH: 6.5 (ref 5.0–8.0)

## 2021-04-24 LAB — COMPREHENSIVE METABOLIC PANEL
ALT: 31 U/L (ref 0–44)
AST: 29 U/L (ref 15–41)
Albumin: 4.1 g/dL (ref 3.5–5.0)
Alkaline Phosphatase: 96 U/L (ref 38–126)
Anion gap: 8 (ref 5–15)
BUN: 15 mg/dL (ref 8–23)
CO2: 28 mmol/L (ref 22–32)
Calcium: 9.8 mg/dL (ref 8.9–10.3)
Chloride: 104 mmol/L (ref 98–111)
Creatinine, Ser: 1 mg/dL (ref 0.61–1.24)
GFR, Estimated: >60 mL/min (ref >60)
Glucose, Bld: 103 mg/dL — ABNORMAL HIGH (ref 70–99)
Potassium: 4.8 mmol/L (ref 3.5–5.1)
Sodium: 140 mmol/L (ref 135–145)
Total Bilirubin: 1.2 mg/dL (ref 0.3–1.2)
Total Protein: 7.4 g/dL (ref 6.5–8.1)

## 2021-04-24 LAB — SARS CORONAVIRUS 2 (TAT 6-24 HRS): SARS Coronavirus 2: NEGATIVE

## 2021-04-24 LAB — APTT: aPTT: 32 seconds (ref 24–36)

## 2021-04-24 LAB — SURGICAL PCR SCREEN
MRSA, PCR: NEGATIVE
Staphylococcus aureus: POSITIVE — AB

## 2021-04-24 NOTE — Progress Notes (Addendum)
PCP: Sadie Haber at [facility], Dr, Christella Noa Cardiologist: Denies  EKG: 10/02/20, Care Everywhere, NSR, requested tracing via fax CXR: 10/01/20 Care Everywhere ECHO: 01/22/21 Stress Test: denies Cardiac Cath: denies Sleep Study: approx 2010 in [place], unable to recall exact location  Arrival time 6:30am for Wednesday cases  ERAS drink provided  Covid tested during appt, aware to wear a mask  Patient denies shortness of breath, fever, cough, and chest pain at PAT appointment.  Patient verbalized understanding of instructions provided today at the PAT appointment.  Patient asked to review instructions at home and day of surgery.

## 2021-04-25 NOTE — Anesthesia Preprocedure Evaluation (Addendum)
Anesthesia Evaluation  Patient identified by MRN, date of birth, ID band Patient awake    Reviewed: Allergy & Precautions, NPO status , Patient's Chart, lab work & pertinent test results  Airway Mallampati: II  TM Distance: >3 FB Neck ROM: Full    Dental no notable dental hx.    Pulmonary sleep apnea and Continuous Positive Airway Pressure Ventilation , former smoker,    Pulmonary exam normal breath sounds clear to auscultation       Cardiovascular Exercise Tolerance: Good METS: 5 - 7 Mets hypertension, Pt. on medications Normal cardiovascular exam+ dysrhythmias  Rhythm:Regular Rate:Normal  H/o PE on Xarelto. Last dose 04/23/21  EKG 09/2020: Diagnosis Normal sinus rhythm  Normal ECG  When compared with ECG of 01-Oct-2020 21:53,  No significant change was found     Neuro/Psych PSYCHIATRIC DISORDERS Depression    GI/Hepatic Neg liver ROS, hiatal hernia, GERD  ,  Endo/Other  negative endocrine ROS  Renal/GU negative Renal ROS  negative genitourinary   Musculoskeletal  (+) Arthritis  (chronic back pain), Osteoarthritis,    Abdominal   Peds negative pediatric ROS (+)  Hematology negative hematology ROS (+)   Anesthesia Other Findings   Reproductive/Obstetrics negative OB ROS                            Anesthesia Physical Anesthesia Plan  ASA: 3  Anesthesia Plan: General   Post-op Pain Management:    Induction: Intravenous  PONV Risk Score and Plan: Treatment may vary due to age or medical condition, Ondansetron, Dexamethasone and Midazolam  Airway Management Planned: Oral ETT  Additional Equipment: None  Intra-op Plan:   Post-operative Plan: Extubation in OR  Informed Consent: I have reviewed the patients History and Physical, chart, labs and discussed the procedure including the risks, benefits and alternatives for the proposed anesthesia with the patient or authorized  representative who has indicated his/her understanding and acceptance.       Plan Discussed with: Anesthesiologist  Anesthesia Plan Comments: (On xarelto (unsure why). Last dose _______)       Anesthesia Quick Evaluation

## 2021-04-26 ENCOUNTER — Other Ambulatory Visit: Payer: Self-pay

## 2021-04-26 ENCOUNTER — Encounter (HOSPITAL_COMMUNITY)
Admission: RE | Disposition: A | Discharge status: Home / Self Care | Payer: Self-pay | Source: Home / Self Care | Attending: Orthopedic Surgery

## 2021-04-26 ENCOUNTER — Encounter (HOSPITAL_COMMUNITY): Payer: Self-pay | Admitting: Orthopedic Surgery

## 2021-04-26 ENCOUNTER — Inpatient Hospital Stay (HOSPITAL_COMMUNITY): Payer: BC Managed Care – PPO | Admitting: Anesthesiology

## 2021-04-26 ENCOUNTER — Inpatient Hospital Stay (HOSPITAL_COMMUNITY): Payer: BC Managed Care – PPO

## 2021-04-26 ENCOUNTER — Inpatient Hospital Stay (HOSPITAL_COMMUNITY)
Admission: RE | Admit: 2021-04-26 | Discharge: 2021-04-28 | DRG: 455 | Disposition: A | Discharge status: Home / Self Care | Payer: BC Managed Care – PPO | Attending: Orthopedic Surgery | Admitting: Orthopedic Surgery

## 2021-04-26 DIAGNOSIS — Z87891 Personal history of nicotine dependence: Secondary | ICD-10-CM

## 2021-04-26 DIAGNOSIS — M4807 Spinal stenosis, lumbosacral region: Secondary | ICD-10-CM | POA: Diagnosis present

## 2021-04-26 DIAGNOSIS — G4733 Obstructive sleep apnea (adult) (pediatric): Secondary | ICD-10-CM | POA: Diagnosis present

## 2021-04-26 DIAGNOSIS — M5116 Intervertebral disc disorders with radiculopathy, lumbar region: Secondary | ICD-10-CM | POA: Diagnosis present

## 2021-04-26 DIAGNOSIS — Z888 Allergy status to other drugs, medicaments and biological substances status: Secondary | ICD-10-CM | POA: Diagnosis not present

## 2021-04-26 DIAGNOSIS — Z7901 Long term (current) use of anticoagulants: Secondary | ICD-10-CM | POA: Diagnosis not present

## 2021-04-26 DIAGNOSIS — M2578 Osteophyte, vertebrae: Secondary | ICD-10-CM | POA: Diagnosis not present

## 2021-04-26 DIAGNOSIS — Z880 Allergy status to penicillin: Secondary | ICD-10-CM

## 2021-04-26 DIAGNOSIS — Z79899 Other long term (current) drug therapy: Secondary | ICD-10-CM | POA: Diagnosis not present

## 2021-04-26 DIAGNOSIS — M5117 Intervertebral disc disorders with radiculopathy, lumbosacral region: Secondary | ICD-10-CM | POA: Diagnosis present

## 2021-04-26 DIAGNOSIS — M5416 Radiculopathy, lumbar region: Secondary | ICD-10-CM | POA: Diagnosis present

## 2021-04-26 DIAGNOSIS — I10 Essential (primary) hypertension: Secondary | ICD-10-CM | POA: Diagnosis present

## 2021-04-26 DIAGNOSIS — Z20822 Contact with and (suspected) exposure to covid-19: Secondary | ICD-10-CM | POA: Diagnosis present

## 2021-04-26 DIAGNOSIS — Z981 Arthrodesis status: Secondary | ICD-10-CM | POA: Diagnosis not present

## 2021-04-26 DIAGNOSIS — K219 Gastro-esophageal reflux disease without esophagitis: Secondary | ICD-10-CM | POA: Diagnosis present

## 2021-04-26 DIAGNOSIS — M5410 Radiculopathy, site unspecified: Secondary | ICD-10-CM | POA: Diagnosis present

## 2021-04-26 DIAGNOSIS — Z96653 Presence of artificial knee joint, bilateral: Secondary | ICD-10-CM | POA: Diagnosis present

## 2021-04-26 DIAGNOSIS — Z885 Allergy status to narcotic agent status: Secondary | ICD-10-CM | POA: Diagnosis not present

## 2021-04-26 DIAGNOSIS — R509 Fever, unspecified: Secondary | ICD-10-CM | POA: Diagnosis not present

## 2021-04-26 DIAGNOSIS — M48061 Spinal stenosis, lumbar region without neurogenic claudication: Principal | ICD-10-CM | POA: Diagnosis present

## 2021-04-26 DIAGNOSIS — M545 Low back pain, unspecified: Secondary | ICD-10-CM | POA: Diagnosis not present

## 2021-04-26 DIAGNOSIS — Z8249 Family history of ischemic heart disease and other diseases of the circulatory system: Secondary | ICD-10-CM

## 2021-04-26 DIAGNOSIS — G8929 Other chronic pain: Secondary | ICD-10-CM

## 2021-04-26 DIAGNOSIS — M4326 Fusion of spine, lumbar region: Secondary | ICD-10-CM | POA: Diagnosis not present

## 2021-04-26 DIAGNOSIS — Z9889 Other specified postprocedural states: Secondary | ICD-10-CM | POA: Diagnosis not present

## 2021-04-26 DIAGNOSIS — Z419 Encounter for procedure for purposes other than remedying health state, unspecified: Secondary | ICD-10-CM

## 2021-04-26 DIAGNOSIS — Z0389 Encounter for observation for other suspected diseases and conditions ruled out: Secondary | ICD-10-CM | POA: Diagnosis not present

## 2021-04-26 HISTORY — PX: ABDOMINAL EXPOSURE: SHX5708

## 2021-04-26 HISTORY — PX: ANTERIOR LUMBAR FUSION: SHX1170

## 2021-04-26 LAB — GLUCOSE, CAPILLARY: Glucose-Capillary: 102 mg/dL — ABNORMAL HIGH (ref 70–99)

## 2021-04-26 SURGERY — ANTERIOR LUMBAR FUSION 2 LEVELS
Anesthesia: General | Site: Abdomen

## 2021-04-26 MED ORDER — ROCURONIUM BROMIDE 10 MG/ML (PF) SYRINGE
PREFILLED_SYRINGE | INTRAVENOUS | Status: DC | PRN
  Administered 2021-04-26 (×2): 10 mg via INTRAVENOUS
  Administered 2021-04-26: 100 mg via INTRAVENOUS
  Administered 2021-04-26: 20 mg via INTRAVENOUS
  Administered 2021-04-26: 10 mg via INTRAVENOUS
  Administered 2021-04-26: 20 mg via INTRAVENOUS

## 2021-04-26 MED ORDER — MENTHOL 3 MG MT LOZG: 1.0000 | LOZENGE | OROMUCOSAL | Status: DC | PRN

## 2021-04-26 MED ORDER — BISACODYL 5 MG PO TBEC: 5.0000 mg | DELAYED_RELEASE_TABLET | Freq: Every day | ORAL | Status: DC | PRN

## 2021-04-26 MED ORDER — PANTOPRAZOLE SODIUM 40 MG PO TBEC
40.0000 mg | DELAYED_RELEASE_TABLET | Freq: Every day | ORAL | Status: DC
  Administered 2021-04-27 – 2021-04-28 (×2): 40 mg via ORAL
  Filled 2021-04-26 (×2): qty 1

## 2021-04-26 MED ORDER — PHENYLEPHRINE HCL (PRESSORS) 10 MG/ML IV SOLN
INTRAVENOUS | Status: DC | PRN
  Administered 2021-04-26: 80 ug via INTRAVENOUS

## 2021-04-26 MED ORDER — SENNOSIDES-DOCUSATE SODIUM 8.6-50 MG PO TABS
1.0000 | ORAL_TABLET | Freq: Every evening | ORAL | Status: DC | PRN
  Administered 2021-04-27: 22:00:00 1 via ORAL
  Filled 2021-04-26: qty 1

## 2021-04-26 MED ORDER — POVIDONE-IODINE 7.5 % EX SOLN: Freq: Once | CUTANEOUS | Status: DC

## 2021-04-26 MED ORDER — BUPIVACAINE LIPOSOME 1.3 % IJ SUSP
INTRAMUSCULAR | Status: AC
  Filled 2021-04-26: qty 20

## 2021-04-26 MED ORDER — ALUM & MAG HYDROXIDE-SIMETH 200-200-20 MG/5ML PO SUSP: 30.0000 mL | Freq: Four times a day (QID) | ORAL | Status: DC | PRN

## 2021-04-26 MED ORDER — ORAL CARE MOUTH RINSE: 15.0000 mL | Freq: Once | OROMUCOSAL | Status: AC

## 2021-04-26 MED ORDER — FENTANYL CITRATE (PF) 250 MCG/5ML IJ SOLN
INTRAMUSCULAR | Status: DC | PRN
  Administered 2021-04-26: 75 ug via INTRAVENOUS
  Administered 2021-04-26 (×3): 25 ug via INTRAVENOUS
  Administered 2021-04-26: 100 ug via INTRAVENOUS

## 2021-04-26 MED ORDER — 0.9 % SODIUM CHLORIDE (POUR BTL) OPTIME
TOPICAL | Status: DC | PRN
  Administered 2021-04-26: 1000 mL

## 2021-04-26 MED ORDER — ACETAMINOPHEN 325 MG PO TABS: 650.0000 mg | ORAL_TABLET | ORAL | Status: DC | PRN

## 2021-04-26 MED ORDER — DEXAMETHASONE SODIUM PHOSPHATE 10 MG/ML IJ SOLN
INTRAMUSCULAR | Status: DC | PRN
  Administered 2021-04-26: 10 mg via INTRAVENOUS

## 2021-04-26 MED ORDER — LIDOCAINE 2% (20 MG/ML) 5 ML SYRINGE
INTRAMUSCULAR | Status: DC | PRN
  Administered 2021-04-26: 80 mg via INTRAVENOUS

## 2021-04-26 MED ORDER — FENTANYL CITRATE (PF) 100 MCG/2ML IJ SOLN
50.0000 ug | INTRAMUSCULAR | Status: DC | PRN
  Administered 2021-04-26 (×2): 50 ug via INTRAVENOUS

## 2021-04-26 MED ORDER — THROMBIN 20000 UNITS EX KIT
PACK | CUTANEOUS | Status: AC
  Filled 2021-04-26: qty 1

## 2021-04-26 MED ORDER — BUPROPION HCL ER (XL) 300 MG PO TB24
300.0000 mg | ORAL_TABLET | Freq: Every morning | ORAL | Status: DC
  Administered 2021-04-28: 300 mg via ORAL
  Filled 2021-04-26: qty 1

## 2021-04-26 MED ORDER — ACETAMINOPHEN 500 MG PO TABS
1000.0000 mg | ORAL_TABLET | Freq: Once | ORAL | Status: AC
  Administered 2021-04-26: 1000 mg via ORAL
  Filled 2021-04-26: qty 2

## 2021-04-26 MED ORDER — ROSUVASTATIN CALCIUM 5 MG PO TABS
10.0000 mg | ORAL_TABLET | Freq: Every day | ORAL | Status: DC
  Administered 2021-04-26 – 2021-04-28 (×3): 10 mg via ORAL
  Filled 2021-04-26 (×3): qty 2

## 2021-04-26 MED ORDER — DIPHENHYDRAMINE HCL 25 MG PO CAPS
25.0000 mg | ORAL_CAPSULE | ORAL | Status: DC | PRN
  Administered 2021-04-26: 16:00:00 25 mg via ORAL
  Filled 2021-04-26: qty 1

## 2021-04-26 MED ORDER — HEPARIN 6000 UNIT IRRIGATION SOLUTION
Status: AC
  Filled 2021-04-26: qty 500

## 2021-04-26 MED ORDER — ROCURONIUM BROMIDE 10 MG/ML (PF) SYRINGE
PREFILLED_SYRINGE | INTRAVENOUS | Status: AC
  Filled 2021-04-26: qty 10

## 2021-04-26 MED ORDER — HYDROMORPHONE HCL 1 MG/ML IJ SOLN
0.5000 mg | INTRAMUSCULAR | Status: DC | PRN
  Administered 2021-04-27: 12:00:00 0.5 mg via INTRAVENOUS
  Filled 2021-04-26: qty 0.5

## 2021-04-26 MED ORDER — LACTATED RINGERS IV SOLN: INTRAVENOUS | Status: DC

## 2021-04-26 MED ORDER — LORATADINE 10 MG PO TABS
10.0000 mg | ORAL_TABLET | Freq: Every evening | ORAL | Status: DC
  Administered 2021-04-26 – 2021-04-27 (×2): 10 mg via ORAL
  Filled 2021-04-26 (×2): qty 1

## 2021-04-26 MED ORDER — THROMBIN 20000 UNITS EX SOLR
CUTANEOUS | Status: DC | PRN
  Administered 2021-04-26: 20000 [IU] via TOPICAL

## 2021-04-26 MED ORDER — HEMOSTATIC AGENTS (NO CHARGE) OPTIME
TOPICAL | Status: DC | PRN
  Administered 2021-04-26: 1 via TOPICAL

## 2021-04-26 MED ORDER — PHENYLEPHRINE 40 MCG/ML (10ML) SYRINGE FOR IV PUSH (FOR BLOOD PRESSURE SUPPORT)
PREFILLED_SYRINGE | INTRAVENOUS | Status: AC
  Filled 2021-04-26: qty 10

## 2021-04-26 MED ORDER — METHOCARBAMOL 1000 MG/10ML IJ SOLN
500.0000 mg | Freq: Four times a day (QID) | INTRAVENOUS | Status: DC | PRN
  Filled 2021-04-26: qty 5

## 2021-04-26 MED ORDER — LIDOCAINE 2% (20 MG/ML) 5 ML SYRINGE
INTRAMUSCULAR | Status: AC
  Filled 2021-04-26: qty 5

## 2021-04-26 MED ORDER — VANCOMYCIN HCL 1500 MG/300ML IV SOLN
1500.0000 mg | INTRAVENOUS | Status: AC
  Administered 2021-04-26: 1500 mg via INTRAVENOUS
  Filled 2021-04-26: qty 300

## 2021-04-26 MED ORDER — ACETAMINOPHEN 650 MG RE SUPP: 650.0000 mg | RECTAL | Status: DC | PRN

## 2021-04-26 MED ORDER — SUGAMMADEX SODIUM 500 MG/5ML IV SOLN
INTRAVENOUS | Status: AC
  Filled 2021-04-26: qty 5

## 2021-04-26 MED ORDER — MIDAZOLAM HCL 5 MG/5ML IJ SOLN
INTRAMUSCULAR | Status: DC | PRN
  Administered 2021-04-26: 2 mg via INTRAVENOUS

## 2021-04-26 MED ORDER — LACTATED RINGERS IV SOLN: INTRAVENOUS | Status: DC | PRN

## 2021-04-26 MED ORDER — ONDANSETRON HCL 4 MG/2ML IJ SOLN
INTRAMUSCULAR | Status: DC | PRN
  Administered 2021-04-26: 4 mg via INTRAVENOUS

## 2021-04-26 MED ORDER — FENTANYL CITRATE (PF) 100 MCG/2ML IJ SOLN
INTRAMUSCULAR | Status: AC
  Filled 2021-04-26: qty 2

## 2021-04-26 MED ORDER — SODIUM CHLORIDE 0.9% FLUSH: 3.0000 mL | INTRAVENOUS | Status: DC | PRN

## 2021-04-26 MED ORDER — AMISULPRIDE (ANTIEMETIC) 5 MG/2ML IV SOLN: 10.0000 mg | Freq: Once | INTRAVENOUS | Status: DC | PRN

## 2021-04-26 MED ORDER — PROMETHAZINE HCL 25 MG/ML IJ SOLN: 6.2500 mg | INTRAMUSCULAR | Status: DC | PRN

## 2021-04-26 MED ORDER — PROPOFOL 10 MG/ML IV BOLUS
INTRAVENOUS | Status: DC | PRN
  Administered 2021-04-26: 200 mg via INTRAVENOUS

## 2021-04-26 MED ORDER — OXYCODONE-ACETAMINOPHEN 5-325 MG PO TABS
1.0000 | ORAL_TABLET | ORAL | Status: DC | PRN
  Administered 2021-04-26 – 2021-04-28 (×9): 2 via ORAL
  Filled 2021-04-26 (×8): qty 2

## 2021-04-26 MED ORDER — ONDANSETRON HCL 4 MG/2ML IJ SOLN: 4.0000 mg | Freq: Four times a day (QID) | INTRAMUSCULAR | Status: DC | PRN

## 2021-04-26 MED ORDER — PROPOFOL 10 MG/ML IV BOLUS
INTRAVENOUS | Status: AC
  Filled 2021-04-26: qty 20

## 2021-04-26 MED ORDER — CHLORHEXIDINE GLUCONATE CLOTH 2 % EX PADS: 6.0000 | MEDICATED_PAD | Freq: Once | CUTANEOUS | Status: DC

## 2021-04-26 MED ORDER — DEXAMETHASONE SODIUM PHOSPHATE 10 MG/ML IJ SOLN
INTRAMUSCULAR | Status: AC
  Filled 2021-04-26: qty 1

## 2021-04-26 MED ORDER — ZOLPIDEM TARTRATE 5 MG PO TABS: 5.0000 mg | ORAL_TABLET | Freq: Every evening | ORAL | Status: DC | PRN

## 2021-04-26 MED ORDER — PREGABALIN 75 MG PO CAPS
150.0000 mg | ORAL_CAPSULE | Freq: Two times a day (BID) | ORAL | Status: DC
  Administered 2021-04-26 – 2021-04-28 (×4): 150 mg via ORAL
  Filled 2021-04-26 (×4): qty 2

## 2021-04-26 MED ORDER — SODIUM CHLORIDE 0.9 % IV SOLN
250.0000 mL | INTRAVENOUS | Status: DC
  Administered 2021-04-26: 250 mL via INTRAVENOUS

## 2021-04-26 MED ORDER — CHLORHEXIDINE GLUCONATE 0.12 % MT SOLN
15.0000 mL | Freq: Once | OROMUCOSAL | Status: AC
  Administered 2021-04-26: 15 mL via OROMUCOSAL
  Filled 2021-04-26: qty 15

## 2021-04-26 MED ORDER — CEFAZOLIN SODIUM-DEXTROSE 2-4 GM/100ML-% IV SOLN
2.0000 g | Freq: Once | INTRAVENOUS | Status: AC
  Administered 2021-04-26: 2 g via INTRAVENOUS
  Filled 2021-04-26: qty 100

## 2021-04-26 MED ORDER — MIDAZOLAM HCL 2 MG/2ML IJ SOLN
INTRAMUSCULAR | Status: AC
  Filled 2021-04-26: qty 2

## 2021-04-26 MED ORDER — SODIUM CHLORIDE 0.9% FLUSH
3.0000 mL | Freq: Two times a day (BID) | INTRAVENOUS | Status: DC
  Administered 2021-04-26 – 2021-04-27 (×2): 3 mL via INTRAVENOUS

## 2021-04-26 MED ORDER — ADULT MULTIVITAMIN W/MINERALS CH
1.0000 | ORAL_TABLET | Freq: Every day | ORAL | Status: DC
  Administered 2021-04-26 – 2021-04-28 (×2): 1 via ORAL
  Filled 2021-04-26 (×2): qty 1

## 2021-04-26 MED ORDER — DOCUSATE SODIUM 100 MG PO CAPS
100.0000 mg | ORAL_CAPSULE | Freq: Two times a day (BID) | ORAL | Status: DC
  Administered 2021-04-26 – 2021-04-28 (×5): 100 mg via ORAL
  Filled 2021-04-26 (×6): qty 1

## 2021-04-26 MED ORDER — SUGAMMADEX SODIUM 200 MG/2ML IV SOLN
INTRAVENOUS | Status: DC | PRN
  Administered 2021-04-26: 234 mg via INTRAVENOUS

## 2021-04-26 MED ORDER — GABAPENTIN 300 MG PO CAPS
300.0000 mg | ORAL_CAPSULE | Freq: Once | ORAL | Status: DC
  Filled 2021-04-26: qty 1

## 2021-04-26 MED ORDER — POTASSIUM CHLORIDE IN NACL 20-0.9 MEQ/L-% IV SOLN: INTRAVENOUS | Status: DC

## 2021-04-26 MED ORDER — KETAMINE HCL 50 MG/5ML IJ SOSY
PREFILLED_SYRINGE | INTRAMUSCULAR | Status: AC
  Filled 2021-04-26: qty 5

## 2021-04-26 MED ORDER — LISINOPRIL 10 MG PO TABS
10.0000 mg | ORAL_TABLET | Freq: Every day | ORAL | Status: DC
  Administered 2021-04-26 – 2021-04-28 (×2): 10 mg via ORAL
  Filled 2021-04-26 (×2): qty 1

## 2021-04-26 MED ORDER — KETAMINE HCL 10 MG/ML IJ SOLN
INTRAMUSCULAR | Status: DC | PRN
  Administered 2021-04-26 (×4): 10 mg via INTRAVENOUS

## 2021-04-26 MED ORDER — FLEET ENEMA 7-19 GM/118ML RE ENEM: 1.0000 | ENEMA | Freq: Once | RECTAL | Status: DC | PRN

## 2021-04-26 MED ORDER — FENTANYL CITRATE (PF) 250 MCG/5ML IJ SOLN
INTRAMUSCULAR | Status: AC
  Filled 2021-04-26: qty 5

## 2021-04-26 MED ORDER — METHOCARBAMOL 500 MG PO TABS
500.0000 mg | ORAL_TABLET | Freq: Four times a day (QID) | ORAL | Status: DC | PRN
  Administered 2021-04-26 – 2021-04-28 (×5): 500 mg via ORAL
  Filled 2021-04-26 (×5): qty 1

## 2021-04-26 MED ORDER — ONDANSETRON HCL 4 MG/2ML IJ SOLN
INTRAMUSCULAR | Status: AC
  Filled 2021-04-26: qty 2

## 2021-04-26 MED ORDER — PHENYLEPHRINE HCL-NACL 20-0.9 MG/250ML-% IV SOLN
INTRAVENOUS | Status: DC | PRN
  Administered 2021-04-26: 15 ug/min via INTRAVENOUS

## 2021-04-26 MED ORDER — ONDANSETRON HCL 4 MG PO TABS: 4.0000 mg | ORAL_TABLET | Freq: Four times a day (QID) | ORAL | Status: DC | PRN

## 2021-04-26 MED ORDER — TAMSULOSIN HCL 0.4 MG PO CAPS
0.4000 mg | ORAL_CAPSULE | Freq: Every evening | ORAL | Status: DC
  Administered 2021-04-26 – 2021-04-27 (×2): 0.4 mg via ORAL
  Filled 2021-04-26 (×2): qty 1

## 2021-04-26 MED ORDER — PHENOL 1.4 % MT LIQD: 1.0000 | OROMUCOSAL | Status: DC | PRN

## 2021-04-26 SURGICAL SUPPLY — 91 items
AGENT HMST KT MTR STRL THRMB (HEMOSTASIS)
APL SKNCLS STERI-STRIP NONHPOA (GAUZE/BANDAGES/DRESSINGS) ×1
APPLIER CLIP 11 MED OPEN (CLIP) ×4
APR CLP MED 11 20 MLT OPN (CLIP) ×2
BAG COUNTER SPONGE SURGICOUNT (BAG) ×4 IMPLANT
BAG SPNG CNTER NS LX DISP (BAG) ×2
BENZOIN TINCTURE PRP APPL 2/3 (GAUZE/BANDAGES/DRESSINGS) ×1 IMPLANT
BLADE CLIPPER SURG (BLADE) ×2 IMPLANT
BLADE SURG 10 STRL SS (BLADE) ×3 IMPLANT
BONE VIVIGEN FORMABLE 10CC (Bone Implant) ×2 IMPLANT
BUR PRESCISION 1.7 ELITE (BURR) IMPLANT
CLIP APPLIE 11 MED OPEN (CLIP) ×2 IMPLANT
CLIP LIGATING EXTRA MED SLVR (CLIP) ×2 IMPLANT
CORD BIPOLAR FORCEPS 12FT (ELECTRODE) ×3 IMPLANT
COVER SURGICAL LIGHT HANDLE (MISCELLANEOUS) ×3 IMPLANT
DRAPE C-ARM 42X72 X-RAY (DRAPES) ×4 IMPLANT
DRAPE C-ARMOR (DRAPES) ×1 IMPLANT
DRAPE POUCH INSTRU U-SHP 10X18 (DRAPES) ×3 IMPLANT
DRAPE UTILITY 15X26 TOWEL STRL (DRAPES) ×1 IMPLANT
DRSG MEPILEX BORDER 4X12 (GAUZE/BANDAGES/DRESSINGS) ×1 IMPLANT
DURAPREP 26ML APPLICATOR (WOUND CARE) ×2 IMPLANT
ELECT BLADE 4.0 EZ CLEAN MEGAD (MISCELLANEOUS) ×2
ELECT BLADE 6.5 EXT (BLADE) ×1 IMPLANT
ELECT CAUTERY BLADE 6.4 (BLADE) ×2 IMPLANT
ELECT REM PT RETURN 9FT ADLT (ELECTROSURGICAL) ×2
ELECTRODE BLDE 4.0 EZ CLN MEGD (MISCELLANEOUS) ×2 IMPLANT
ELECTRODE REM PT RTRN 9FT ADLT (ELECTROSURGICAL) ×1 IMPLANT
FILTER STRAW FLUID ASPIR (MISCELLANEOUS) ×1 IMPLANT
GAUZE 4X4 16PLY ~~LOC~~+RFID DBL (SPONGE) ×1 IMPLANT
GAUZE SPONGE 4X4 12PLY STRL (GAUZE/BANDAGES/DRESSINGS) ×1 IMPLANT
GLOVE SRG 8 PF TXTR STRL LF DI (GLOVE) ×3 IMPLANT
GLOVE SURG ENC MOIS LTX SZ6.5 (GLOVE) ×2 IMPLANT
GLOVE SURG ENC MOIS LTX SZ7.5 (GLOVE) ×2 IMPLANT
GLOVE SURG ENC MOIS LTX SZ8 (GLOVE) ×2 IMPLANT
GLOVE SURG MICRO LTX SZ7.5 (GLOVE) ×2 IMPLANT
GLOVE SURG UNDER POLY LF SZ7 (GLOVE) ×2 IMPLANT
GLOVE SURG UNDER POLY LF SZ8 (GLOVE) ×6
GOWN STRL REUS W/ TWL LRG LVL3 (GOWN DISPOSABLE) ×2 IMPLANT
GOWN STRL REUS W/ TWL XL LVL3 (GOWN DISPOSABLE) ×2 IMPLANT
GOWN STRL REUS W/TWL LRG LVL3 (GOWN DISPOSABLE) ×4
GOWN STRL REUS W/TWL XL LVL3 (GOWN DISPOSABLE) ×6
GRAFT BNE MATRIX VG FRMBL L 10 (Bone Implant) IMPLANT
HEMOSTAT SURGICEL 2X14 (HEMOSTASIS) IMPLANT
INSERT FOGARTY 61MM (MISCELLANEOUS) IMPLANT
INSERT FOGARTY SM (MISCELLANEOUS) IMPLANT
KIT BASIN OR (CUSTOM PROCEDURE TRAY) ×2 IMPLANT
KIT TURNOVER KIT B (KITS) ×2 IMPLANT
NDL HYPO 25GX1X1/2 BEV (NEEDLE) ×1 IMPLANT
NDL SPNL 18GX3.5 QUINCKE PK (NEEDLE) ×1 IMPLANT
NEEDLE HYPO 25GX1X1/2 BEV (NEEDLE) ×2 IMPLANT
NEEDLE SPNL 18GX3.5 QUINCKE PK (NEEDLE) ×4 IMPLANT
NS IRRIG 1000ML POUR BTL (IV SOLUTION) ×2 IMPLANT
PACK LAMINECTOMY ORTHO (CUSTOM PROCEDURE TRAY) ×2 IMPLANT
PACK UNIVERSAL I (CUSTOM PROCEDURE TRAY) ×2 IMPLANT
PAD ARMBOARD 7.5X6 YLW CONV (MISCELLANEOUS) ×8 IMPLANT
PATTIES SURGICAL .5 X1 (DISPOSABLE) ×2 IMPLANT
PENCIL BUTTON HOLSTER BLD 10FT (ELECTRODE) ×1 IMPLANT
SPACER ALIF EXP MAG 26X34 8D (Spacer) ×1 IMPLANT
SPACER ALIF EXP MAG 29X39X9 8D (Spacer) ×1 IMPLANT
SPONGE INTESTINAL PEANUT (DISPOSABLE) ×7 IMPLANT
SPONGE SURGIFOAM ABS GEL 100 (HEMOSTASIS) ×4 IMPLANT
SPONGE T-LAP 18X18 ~~LOC~~+RFID (SPONGE) ×2 IMPLANT
SPONGE T-LAP 4X18 ~~LOC~~+RFID (SPONGE) ×4 IMPLANT
STRIP CLOSURE SKIN 1/2X4 (GAUZE/BANDAGES/DRESSINGS) ×3 IMPLANT
SURGIFLO W/THROMBIN 8M KIT (HEMOSTASIS) IMPLANT
SUT MNCRL AB 4-0 PS2 18 (SUTURE) ×1 IMPLANT
SUT PDS AB 1 CTX 36 (SUTURE) ×6 IMPLANT
SUT PROLENE 4 0 RB 1 (SUTURE) ×4
SUT PROLENE 4-0 RB1 .5 CRCL 36 (SUTURE) IMPLANT
SUT PROLENE 5 0 CC1 (SUTURE) IMPLANT
SUT PROLENE 6 0 C 1 30 (SUTURE) ×2 IMPLANT
SUT PROLENE 6 0 CC (SUTURE) IMPLANT
SUT SILK 0 TIES 10X30 (SUTURE) ×2 IMPLANT
SUT SILK 2 0 TIES 10X30 (SUTURE) ×3 IMPLANT
SUT SILK 2 0SH CR/8 30 (SUTURE) IMPLANT
SUT SILK 3 0 TIES 17X18 (SUTURE) ×2
SUT SILK 3 0SH CR/8 30 (SUTURE) IMPLANT
SUT SILK 3-0 18XBRD TIE BLK (SUTURE) ×1 IMPLANT
SUT VIC AB 0 CT1 18XCR BRD 8 (SUTURE) IMPLANT
SUT VIC AB 0 CT1 8-18 (SUTURE) ×4
SUT VIC AB 1 CT1 18XCR BRD 8 (SUTURE) IMPLANT
SUT VIC AB 1 CT1 8-18 (SUTURE) ×2
SUT VIC AB 2-0 CT2 18 VCP726D (SUTURE) ×4 IMPLANT
SYR BULB IRRIG 60ML STRL (SYRINGE) ×3 IMPLANT
SYR CONTROL 10ML LL (SYRINGE) ×1 IMPLANT
TOWEL GREEN STERILE (TOWEL DISPOSABLE) ×4 IMPLANT
TOWEL GREEN STERILE FF (TOWEL DISPOSABLE) ×2 IMPLANT
TRAY FOLEY W/BAG SLVR 16FR (SET/KITS/TRAYS/PACK) ×2
TRAY FOLEY W/BAG SLVR 16FR ST (SET/KITS/TRAYS/PACK) ×1 IMPLANT
WATER STERILE IRR 1000ML POUR (IV SOLUTION) ×1 IMPLANT
YANKAUER SUCT BULB TIP NO VENT (SUCTIONS) ×2 IMPLANT

## 2021-04-26 NOTE — Transfer of Care (Signed)
Immediate Anesthesia Transfer of Care Note  Patient: Tony Frye  Procedure(s) Performed: ANTERIOR LUMBAR INTERBODY FUSION LUMBAR 4- LUMBAR 5, LUMBAR 5- SACRUM 1 WITH INSTRUMENTATION AND ALLOGRAFT (Abdomen) ABDOMINAL EXPOSURE (Abdomen)  Patient Location: PACU  Anesthesia Type:General  Level of Consciousness: drowsy, patient cooperative and responds to stimulation  Airway & Oxygen Therapy: Patient Spontanous Breathing  Post-op Assessment: Report given to RN and Post -op Vital signs reviewed and stable  Post vital signs: Reviewed and stable  Last Vitals:  Vitals Value Taken Time  BP 102/63 04/26/21 1334  Temp    Pulse 64 04/26/21 1335  Resp 15 04/26/21 1335  SpO2 95 % 04/26/21 1335  Vitals shown include unvalidated device data.  Last Pain:  Vitals:   04/26/21 0737  TempSrc:   PainSc: 3       Patients Stated Pain Goal: 2 (88/67/73 7366)  Complications: No notable events documented.

## 2021-04-26 NOTE — Op Note (Signed)
PATIENT NAME: Tony Frye   MEDICAL RECORD NO.:   [id number]    DATE OF BIRTH: 07/27/1960   DATE OF PROCEDURE: 04/26/2021                                OPERATIVE REPORT    PREOPERATIVE DIAGNOSES: 1. Right-sided lumbar radiculopathy. 2. S/p previous L4/5 and L5/S1 decompression 3. L4-5, L5-S1 spinal stenosis and degenerative disk disease   POSTOPERATIVE DIAGNOSES: 1. Right-sided lumbar radiculopathy. 2. S/p previous L4/5 and L5/S1 decompression 3. L4-5, L5-S1 spinal stenosis and degenerative disk disease   PROCEDURE: 1. Anterior lumbar interbody fusion, L4-5, L5-S1 (expsure peformed by Dr. Fortunato Curling) 2. Insertion of interbody device x2 (Globus expandable spacers). 3. Intraoperative use of fluoroscopy. 4. Use of morselized allograft - Vivigen 5. Cosurgeon with Dr. Fortunato Curling for retroperitoneal exposure   SURGEON:  Phylliss Bob, MD   ASSISTANT:  Pricilla Holm, PA-C.   ANESTHESIA:  General endotracheal anesthesia.   COMPLICATIONS:  None.   DISPOSITION:  Stable.   ESTIMATED BLOOD LOSS:  Minimal.   INDICATIONS FOR SURGERY:  Briefly, Tony Frye is a very pleasant 60 year old male, who has been having progressive debilitating pain in the right leg and low back. The patient did fail appropriate nonoperative measures, but did continue to have significant pain. Given his ongoing pain and dysfunction, we did discuss proceeding with the procedure noted above.  The patient was fully aware of the risks and limitations associated with surgery, and did wish to proceed.  The plan was to proceed with stage I of her procedure today, and to return tomorrow for stage II, specifically, a posterior fusion, and potentially a posterior decompression.      OPERATIVE DETAILS:  On 04/26/2021, the patient was brought to surgery and general endotracheal anesthesia was administered.  The patient was placed supine on the hospital bed.  The patient's abdomen was prepped and draped in the  usual sterile fashion.  An anterior retroperitoneal approach was then performed by Dr. Fortunato Curling.  I did function as his first assistant during the approach.  Once the anterior lumbar spine was noted, we did focus our attention on the L5-S1 intervertebral space.  Of note, visualization of the left disc space was very limited due to an immobile iliac vein.  I then performed a thorough L5-S1 intervertebral diskectomy to the level of the posterior longitudinal ligament. This was toward the right due to the expose. I did remove as much disc material posteriorly and in the region of the foramen as was safely possible, in order decompress the exiting right L5 nerve as thoroughly as possible. I was pleased with the diskectomy that I was able to accomplish.  The endplates were then appropriately prepared and the appropriate sized anterior intervertebral spacer was packed with Vivigen and tamped into position which was expanded to 17mm in height. I was very pleased with the press-fit of the implant.  Dr. Carlis Abbott then scrubbed back into the case, and repositioned the retractors over the L4/5 intervertebral space.    I then performed a thorough and complete L4/5 intervertebral diskectomy to the level of the posterior longitudinal ligament.  I was very pleased with the diskectomy that I was able to accomplish.  The endplates were then appropriately prepared and the appropriate sized anterior intervertebral spacer was packed with Vivigen and tamped into position, and expanded to 64mm. I  was very pleased with the press-fit  of the implant.      I did liberally use AP and lateral fluoroscopy to ensure that the implants were in the appropriate position, and was pleased with the radiographs.  The wound was copiously irrigated.  The fascia was  closed using #1 PDS.  The subcutaneous layer was closed using 0 Vicryl followed by 2-0 Vicryl, and the skin was closed using 4-0 Monocryl. Benzoin and Steri-Strips were  applied followed by sterile dressing.   All instrument counts were correct at the termination of the procedure.    Of note, Pricilla Holm was my assistant throughout surgery, and did aid in retraction, suctioning, and closure for the procedure.     Phylliss Bob, MD

## 2021-04-26 NOTE — H&P (Signed)
History and Physical Interval Note:  04/26/2021 8:17 AM  Tony Frye  has presented today for surgery, with the diagnosis of LUMBAR RADICULOPATHY.  The various methods of treatment have been discussed with the patient and family. After consideration of risks, benefits and other options for treatment, the patient has consented to  Procedure(s): ANTERIOR LUMBAR INTERBODY FUSION LUMBAR 4- LUMBAR 5, LUMBAR 5- SACRUM 1 WITH INSTRUMENTATION AND ALLOGRAFT (N/A) ABDOMINAL EXPOSURE (N/A) as a surgical intervention.  The patient's history has been reviewed, patient examined, no change in status, stable for surgery.  I have reviewed the patient's chart and labs.  Questions were answered to the patient's satisfaction.    L4-L5 and L5-S1 ALIF.  Risk and benefits again discussed.  Marty Heck  Patient name: Tony Frye           MRN: [id number]        DOB: [birth year]        Sex: male   REASON FOR CONSULT: Evaluate for L4-L5 and L5-S1 ALIF   HPI: Tony Frye is a 60 y.o. male, with history of osteoarthritis that presents for two-level ALIF at L4-L5 and L5-S1.  Patient describes severe right lower extremity radiculopathy.  He has undergone previous L5-S1 microdiscectomy in 2016 and an L4-L5 decompression in April 2022 at outside hospital.  Sounds like after his last surgery he had severe right leg pain after surgery especially in the foot.  He has been evaluated by Dr. Phylliss Bob with ongoing right-sided lumbar radiculopathy and noted to have disc degeneration and stenosis at L4-L5 and L5-S1.  He presents for evaluation of two-level ALIF.  He denies any previous abdominal surgeries.  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       Past Medical History:  Diagnosis Date   Cancer ([place])      squamous cell, face   Depressed     Hiatal hernia     Low testosterone     OSA on CPAP     Osteoarthritis      both knees   Reflux esophagitis             Past Surgical History:   Procedure Laterality Date   KNEE ARTHROSCOPY Right     partial ampu       REPLACEMENT TOTAL KNEE Left             Family History  Problem Relation Age of Onset   Cancer - Colon Mother     Cancer - Other Mother     Heart disease Father     Hypertension Maternal Grandfather        SOCIAL HISTORY: Social History         Socioeconomic History   Marital status: Married      Spouse name: Not on file   Number of children: Not on file   Years of education: Not on file   Highest education level: Not on file  Occupational History   Not on file  Tobacco Use   Smoking status: Former      Packs/day: 0.50      Types: Cigarettes      Quit date: 2002      Years since quitting: 20.8   Smokeless tobacco: Never  Vaping Use   Vaping Use: Never used  Substance and Sexual Activity   Alcohol use: Not on file   Drug use: Not on file   Sexual activity: Not on file  Other Topics Concern  Not on file  Social History Narrative   Not on file    Social Determinants of Health    Financial Resource Strain: Not on file  Food Insecurity: Not on file  Transportation Needs: Not on file  Physical Activity: Not on file  Stress: Not on file  Social Connections: Not on file  Intimate Partner Violence: Not on file          Allergies  Allergen Reactions   Penicillins Anaphylaxis   Buprenorphine Hcl Rash   Nexium [Esomeprazole Magnesium] Nausea And Vomiting   Codeine Hives and Rash   Morphine And Related Rash            Current Outpatient Medications  Medication Sig Dispense Refill   buPROPion (WELLBUTRIN XL) 300 MG 24 hr tablet Take 300 mg by mouth every morning.       diclofenac (VOLTAREN) 75 MG EC tablet Take 1 tablet by mouth every 3 (three) days.       levocetirizine (XYZAL) 5 MG tablet SMARTSIG:1 Tablet(s) By Mouth Every Evening       lisinopril (ZESTRIL) 10 MG tablet Take by mouth.       pantoprazole (PROTONIX) 40 MG tablet Take 40 mg by mouth daily.       pregabalin (LYRICA)  50 MG capsule Take 50 mg by mouth 3 (three) times daily.       rivaroxaban (XARELTO) 10 MG TABS tablet Take 1 tablet (10 mg total) by mouth daily. 30 tablet 5   rivaroxaban (XARELTO) 20 MG TABS tablet Take 1 tablet (20 mg total) by mouth daily with supper for 23 days. 23 tablet 0   rosuvastatin (CRESTOR) 10 MG tablet Take by mouth.       Testosterone Cypionate 200 MG/ML KIT Inject into the muscle. Every 3-4 weeks        No current facility-administered medications for this visit.      REVIEW OF SYSTEMS:  _0  denotes positive finding, _1  denotes negative finding Cardiac   Comments:  Chest pain or chest pressure:      Shortness of breath upon exertion:      Short of breath when lying flat:      Irregular heart rhythm:             Vascular      Pain in calf, thigh, or hip brought on by ambulation:      Pain in feet at night that wakes you up from your sleep:       Blood clot in your veins:      Leg swelling:              Pulmonary      Oxygen at home:      Productive cough:       Wheezing:              Neurologic      Sudden weakness in arms or legs:       Sudden numbness in arms or legs:       Sudden onset of difficulty speaking or slurred speech:      Temporary loss of vision in one eye:       Problems with dizziness:              Gastrointestinal      Blood in stool:       Vomited blood:              Genitourinary  Burning when urinating:       Blood in urine:             Psychiatric      Major depression:              Hematologic      Bleeding problems:      Problems with blood clotting too easily:             Skin      Rashes or ulcers:             Constitutional      Fever or chills:          PHYSICAL EXAM:    Vitals:    04/18/21 0957  BP: 108/72  Pulse: 60  Resp: 18  Temp: 97.7 F (36.5 C)  TempSrc: Temporal  SpO2: 96%  Weight: 256 lb (116.1 kg)  Height: _0  (1.88 m)      GENERAL: The patient is a well-nourished male, in no acute  distress. The vital signs are documented above. CARDIAC: There is a regular rate and rhythm.  VASCULAR:  Palpable femoral pulses bilaterally Palpable DP and PT pulses bilaterally PULMONARY: No respiratory distress. ABDOMEN: Soft and non-tender.  No previous incisions.   MUSCULOSKELETAL: There are no major deformities or cyanosis. NEUROLOGIC: No focal weakness or paresthesias are detected. SKIN: There are no ulcers or rashes noted. PSYCHIATRIC: The patient has a normal affect.   DATA:    MRI reviewed from 03/25/2018 and the aortic bifurcation is at the L4 body and the vein bifurcation is at the top of L5.   Assessment/Plan:   60 year old male with chronic right-sided lumbar radiculopathy who presents for preop evaluation of two-level ALIF at L4-L5 and L5-S1.  I have reviewed his recent MRI imaging and discussed he would be a good candidate for anterior approach.  We discussed paramedian incision over the left rectus muscle and then mobilizing the rectus to enter the retroperitoneum and mobilizing peritoneum and left ureter across midline.  Discussed mobilizing iliac artery and vein to expose the L4-L5 and L5-S1 disc space from the front.  Discussed risk of injury to the above structures.  Questions answered.  Look forward to assisting Dr. Lynann Bologna.   Marty Heck, MD [facility] Office: [phone]

## 2021-04-26 NOTE — H&P (Signed)
PREOPERATIVE H&P  Chief Complaint: Right leg pain  HPI: Tony Frye is a 60 y.o. male who presents with ongoing pain in the right leg  MRI reveals stensis at L4/5 and L5/S1. Pt is s/p decompressions at both levels in 2016 and 2022.  Patient has failed multiple forms of conservative care and continues to have pain (see office notes for additional details regarding the patient's full course of treatment)  Past Medical History:  Diagnosis Date   Cancer ([place])    squamous cell, face   Depressed    Hiatal hernia    Hypertension    Low testosterone    OSA on CPAP    Osteoarthritis    both knees   Reflux esophagitis    Past Surgical History:  Procedure Laterality Date   BACK SURGERY     JOINT REPLACEMENT     KNEE ARTHROSCOPY Right    LUMBAR LAMINECTOMY  2022   partial ampu     REPLACEMENT TOTAL KNEE Left 2008   revision, August 2021   REPLACEMENT TOTAL KNEE Right 2010   Social History   Socioeconomic History   Marital status: Married    Spouse name: Not on file   Number of children: Not on file   Years of education: Not on file   Highest education level: Not on file  Occupational History   Not on file  Tobacco Use   Smoking status: Former    Packs/day: 0.50    Types: Cigarettes    Quit date: 2002    Years since quitting: 20.8   Smokeless tobacco: Never  Vaping Use   Vaping Use: Never used  Substance and Sexual Activity   Alcohol use: Never   Drug use: Never   Sexual activity: Not on file  Other Topics Concern   Not on file  Social History Narrative   Not on file   Social Determinants of Health   Financial Resource Strain: Not on file  Food Insecurity: Not on file  Transportation Needs: Not on file  Physical Activity: Not on file  Stress: Not on file  Social Connections: Not on file   Family History  Problem Relation Age of Onset   Cancer - Colon Mother    Cancer - Other Mother    Heart disease Father    Hypertension Maternal Grandfather     Allergies  Allergen Reactions   Penicillins Anaphylaxis    Childhood allergy, pt states he's no longer allergic and has taken amoxicillin multiple times   Buprenorphine Hcl Rash   Nexium [Esomeprazole Magnesium] Nausea And Vomiting   Codeine Hives and Rash    Pt states he's recently had this med and had no reaction   Morphine And Related Rash   Prior to Admission medications   Medication Sig Start Date End Date Taking? Authorizing Provider  buPROPion (WELLBUTRIN XL) 300 MG 24 hr tablet Take 300 mg by mouth every morning. 11/28/20  Yes [provider]  ciclopirox (PENLAC) 8 % solution Apply 1 application topically 2 (two) times a week. 03/27/21  Yes [provider]  diclofenac (VOLTAREN) 75 MG EC tablet Take 75 mg by mouth every 3 (three) days. 12/14/20  Yes [provider]  levocetirizine (XYZAL) 5 MG tablet Take 5 mg by mouth every evening. 09/20/20  Yes [provider]  lisinopril (ZESTRIL) 10 MG tablet Take 10 mg by mouth daily.   Yes [provider]  Multiple Vitamin (MULTIVITAMIN WITH MINERALS) TABS tablet Take  1 tablet by mouth daily.   Yes [provider]  pantoprazole (PROTONIX) 40 MG tablet Take 40 mg by mouth daily.   Yes [provider]  pregabalin (LYRICA) 150 MG capsule Take 150 mg by mouth 2 (two) times daily. 10/03/20  Yes [provider]  rivaroxaban (XARELTO) 20 MG TABS tablet Take 1 tablet (20 mg total) by mouth daily with supper for 23 days. 03/31/21 04/23/21 Yes Celso Amy, NP  rosuvastatin (CRESTOR) 10 MG tablet Take 10 mg by mouth daily. 03/19/19  Yes [provider]  tamsulosin (FLOMAX) 0.4 MG CAPS capsule Take 0.4 mg by mouth every evening. 03/27/21  Yes [provider]  testosterone cypionate (DEPOTESTOSTERONE CYPIONATE) 200 MG/ML injection Inject 400 mg into the muscle every 21 ( twenty-one) days.   Yes [provider]  rivaroxaban (XARELTO) 10 MG TABS tablet Take 1  tablet (10 mg total) by mouth daily. Patient not taking: Reported on 04/20/2021 12/16/20   Celso Amy, NP     All other systems have been reviewed and were otherwise negative with the exception of those mentioned in the HPI and as above.  Physical Exam: There were no vitals filed for this visit.  There is no height or weight on file to calculate BMI.  General: Alert, no acute distress Cardiovascular: No pedal edema Respiratory: No cyanosis, no use of accessory musculature Skin: No lesions in the area of chief complaint Neurologic: Sensation intact distally Psychiatric: Patient is competent for consent with normal mood and affect Lymphatic: No axillary or cervical lymphadenopathy   Assessment/Plan: RIGHT LUMBAR RADICULOPATHY Plan for Procedure(s): ANTERIOR LUMBAR INTERBODY FUSION LUMBAR 4- LUMBAR 5, LUMBAR 5- SACRUM 1 WITH INSTRUMENTATION AND ALLOGRAFT   Norva Karvonen, MD 04/26/2021 6:55 AM

## 2021-04-26 NOTE — Op Note (Signed)
Date: April 26, 2021  Preoperative diagnosis: Right lumbar radiculopathy  Postoperative diagnosis: Same  Procedure: Anterior spine exposure at the L4-L5 and L5-S1 disc space via anterior retroperitoneal approach for L4-L5 and L5-S1 ALIF  Surgeon: Dr. Marty Heck, MD  Co-surgeon: Dr. Phylliss Bob, MD  Assistant: Pricilla Holm, PA  Indications: Patient is a 60 year old male who presents with ongoing right leg radiculopathy.  MRI revealed stenosis at L4-L5 and L5-S1 and he has had decompressions at both levels in the past.  He has been evaluated by Dr. Lynann Bologna who recommended anterior approach at L4-L5 and L5-S1 for anterior lumbar interbody fusion.  Vascular surgery was asked to assist with exposure.  He presents after risk benefits discussed.  Findings: Paramedian incision was made over the left rectus muscle where the L4-L5 and L5-S1 disc space were marked preoperatively.  Ultimately the anterior rectus sheath was opened and the left rectus muscle was circumferentially mobilized after taking down some of the posterior rectus sheath above arcuate line.  The retroperitoneum was entered and the peritoneum and left ureter mobilized to midline.  Middle sacral vessels were divided between clips.  Initially the L5-S1 disc space and then the L4-L5 were mobilized.  I did ligated several ileolumbar branches.  I put retractors initially at L5-S1 and then came back and moved the retractors to L4-L5.  We confirmed with lateral fluoroscopy that we were at the correct level.  The left iliac vein was severely scarred and adherent from previous spine surgery that made mobilization difficult.  Details: The L4-L5 and L5-S1 disc space was marked over the left rectus muscle with a fluoroscopic C arm in the lateral position.  The abdominal wall was then prepped and draped in standard sterile fashion.  Preoperative antibiotics were given.  Timeout was performed.  A left paramedian incision was then made  over the left rectus muscle and dissection was carried down with Bovie cautery.  Cerebellar retractors were used for added visualization.  The anterior rectus sheath was opened longitudinally with Bovie cautery.  Hemostats were then used to raise flaps underneath the anterior rectus sheath and the rectus muscle was circumferentially mobilized.  I then entered into the retroperitoneum and the peritoneum and left ureter were identified and mobilized out of the retroperitoneum and toward midline.  I took down some of the posterior rectus sheath above arcuate line with Metzenbaum scissors after this was dissected away bluntly.  We pulled the muscle lateral.  I placed a Balfour retractor in the wound with a wet lap pad for added visualization.  My assistant used hand-held Wiley retractors to pull the peritoneum and left ureter to the midline and I continued to mobilize with Kd and suction.  Initially worked at the L5-S1 disc space and the middle sacral vessels were divided between clips.  I then used Kd and suction to mobilize anteriorly on the disc base and bluntly mobilized to get working room on each side of the disc space.  The left iliac vein was very scarred and adherent to the anterior disc base from his previous spine surgery.  I then went lateral to the left iliac artery and this was mobilized to the midline until identified the left iliac vein.  Several iliolumbar branches were identified and ligated between 2-0 silk ties and divided.  I then mobilized the left iliac vein toward the midline and until I visualized the L4-L5 disc space and got good working room on both sides of the disc space.  I then went  back and placed fixed Thompson retractor at the L5-S1 disc space initially with 150 reverse lips on each side of the disc space and fixed malleable retractors craniocaudal.  We put a spinal needle in the disc space and confirmed were at the correct L5-S1 level.  Case was turned over to Dr. Lynann Bologna.  After  the L5-S1 implant was complete, I was called back to the room to move the retractors.  At that point in time I removed the fixed retractors at L5-S1.  We then used hand-held Wiley retractors again to mobilize the left iliac artery and vein across the disc base toward the midline where this had already been mobilized earlier in the case.  I used suction with a Kd to continue bluntly mobilizing the left iliac vein to get all the way to the contralateral side of the disc space.  The vein was very adherent and scarred making mobilization difficult.  Ultimately I had to use Metzenbaum scissors in order to very meticulously dissect the vein away.  Once we had good working room on both sides of the disc base we then put 150 reverse lip retractors on each side of the disc base with the Thompson retractor and then malleable retractors cranial caudal.  A needle was placed in the disc space and we confirm we were at the correct level at L4-L5 on lateral fluoroscopy.  Case was turned over to Dr. Lynann Bologna.  Complication: None  Condition: Stable  Marty Heck, MD [facility] Office: [place]

## 2021-04-26 NOTE — Progress Notes (Signed)
Pharmacy Antibiotic Note  Tony Frye is a 60 y.o. male admitted on 04/26/2021 with surgical prophylaxis.  Pharmacy has been consulted for vancomycin dosing if penicillin allergic. Patient has PCN allergy listed but comment sates "Childhood allergy, pt states he's no longer allergic and has taken amoxicillin multiple times." Will give cefazolin.  Plan: Cefazolin 2g IV x1 12 hours after pre-op vancomycin dose per consult   Height: 6\' 2"  (188 cm) Weight: 117 kg (258 lb) IBW/kg (Calculated) : 82.2  Temp (24hrs), Avg:98.1 F (36.7 C), Min:98 F (36.7 C), Max:98.2 F (36.8 C)  Recent Labs  Lab 04/24/21 1100  WBC 6.4  CREATININE 1.00    Estimated Creatinine Clearance: 104.1 mL/min (by C-G formula based on SCr of 1 mg/dL).    Allergies  Allergen Reactions   Penicillins Anaphylaxis    Childhood allergy, pt states he's no longer allergic and has taken amoxicillin multiple times   Buprenorphine Hcl Rash   Nexium [Esomeprazole Magnesium] Nausea And Vomiting   Codeine Hives and Rash    Pt states he's recently had this med and had no reaction   Morphine And Related Rash       Thank you for allowing pharmacy to be a part of this patient's care.  Benetta Spar, PharmD, BCPS, BCCP Clinical Pharmacist  Please check AMION for all [place] phone numbers After 10:00 PM, call [place] [phone]

## 2021-04-26 NOTE — Anesthesia Procedure Notes (Signed)
Procedure Name: Intubation Date/Time: 04/26/2021 8:58 AM Performed by: Maude Leriche, CRNA Pre-anesthesia Checklist: Patient identified, Emergency Drugs available, Suction available and Patient being monitored Patient Re-evaluated:Patient Re-evaluated prior to induction Oxygen Delivery Method: Circle system utilized Preoxygenation: Pre-oxygenation with 100% oxygen Induction Type: IV induction Ventilation: Two handed mask ventilation required Laryngoscope Size: Miller and 2 Grade View: Grade II Tube type: Oral Tube size: 7.5 mm Number of attempts: 1 Airway Equipment and Method: Stylet Placement Confirmation: ETT inserted through vocal cords under direct vision, positive ETCO2 and breath sounds checked- equal and bilateral Secured at: 25 cm Tube secured with: Tape Dental Injury: Teeth and Oropharynx as per pre-operative assessment

## 2021-04-27 ENCOUNTER — Encounter (HOSPITAL_COMMUNITY): Payer: Self-pay | Admitting: Orthopedic Surgery

## 2021-04-27 ENCOUNTER — Inpatient Hospital Stay (HOSPITAL_COMMUNITY): Payer: BC Managed Care – PPO | Admitting: Certified Registered Nurse Anesthetist

## 2021-04-27 ENCOUNTER — Inpatient Hospital Stay (HOSPITAL_COMMUNITY)
Admission: RE | Disposition: A | Discharge status: Home / Self Care | Payer: Self-pay | Source: Home / Self Care | Attending: Orthopedic Surgery

## 2021-04-27 ENCOUNTER — Inpatient Hospital Stay (HOSPITAL_COMMUNITY): Payer: BC Managed Care – PPO

## 2021-04-27 ENCOUNTER — Inpatient Hospital Stay (HOSPITAL_COMMUNITY)
Admission: RE | Admit: 2021-04-27 | Payer: BC Managed Care – PPO | Source: Ambulatory Visit | Admitting: Orthopedic Surgery

## 2021-04-27 DIAGNOSIS — Z9889 Other specified postprocedural states: Secondary | ICD-10-CM

## 2021-04-27 SURGERY — POSTERIOR LUMBAR FUSION 2 LEVEL
Anesthesia: General | Site: Spine Lumbar

## 2021-04-27 MED ORDER — EPHEDRINE 5 MG/ML INJ
INTRAVENOUS | Status: AC
  Filled 2021-04-27: qty 5

## 2021-04-27 MED ORDER — BUPIVACAINE LIPOSOME 1.3 % IJ SUSP
INTRAMUSCULAR | Status: DC | PRN
  Administered 2021-04-27: 15 mL

## 2021-04-27 MED ORDER — VANCOMYCIN HCL 1000 MG IV SOLR: INTRAVENOUS | Status: DC | PRN

## 2021-04-27 MED ORDER — METHOCARBAMOL 500 MG PO TABS
500.0000 mg | ORAL_TABLET | Freq: Four times a day (QID) | ORAL | 1 refills | Status: DC | PRN
Start: 2021-04-27 — End: 2023-02-13

## 2021-04-27 MED ORDER — PROPOFOL 10 MG/ML IV BOLUS
INTRAVENOUS | Status: AC
  Filled 2021-04-27: qty 20

## 2021-04-27 MED ORDER — ROCURONIUM BROMIDE 10 MG/ML (PF) SYRINGE
PREFILLED_SYRINGE | INTRAVENOUS | Status: AC
  Filled 2021-04-27: qty 10

## 2021-04-27 MED ORDER — MIDAZOLAM HCL 5 MG/5ML IJ SOLN
INTRAMUSCULAR | Status: DC | PRN
  Administered 2021-04-27: 2 mg via INTRAVENOUS

## 2021-04-27 MED ORDER — SUGAMMADEX SODIUM 200 MG/2ML IV SOLN
INTRAVENOUS | Status: DC | PRN
  Administered 2021-04-27: 400 mg via INTRAVENOUS

## 2021-04-27 MED ORDER — ACETAMINOPHEN 10 MG/ML IV SOLN: 1000.0000 mg | Freq: Once | INTRAVENOUS | Status: DC | PRN

## 2021-04-27 MED ORDER — VANCOMYCIN HCL 1000 MG IV SOLR
INTRAVENOUS | Status: DC | PRN
  Administered 2021-04-27: 08:00:00 1500 mg via INTRAVENOUS

## 2021-04-27 MED ORDER — FENTANYL CITRATE (PF) 100 MCG/2ML IJ SOLN
INTRAMUSCULAR | Status: AC
  Filled 2021-04-27: qty 2

## 2021-04-27 MED ORDER — PROPOFOL 10 MG/ML IV BOLUS
INTRAVENOUS | Status: DC | PRN
  Administered 2021-04-27: 200 mg via INTRAVENOUS

## 2021-04-27 MED ORDER — THROMBIN 20000 UNITS EX SOLR
CUTANEOUS | Status: DC | PRN
  Administered 2021-04-27: 09:00:00 20000 [IU] via TOPICAL

## 2021-04-27 MED ORDER — ACETAMINOPHEN 500 MG PO TABS: 1000.0000 mg | ORAL_TABLET | Freq: Once | ORAL | Status: DC | PRN

## 2021-04-27 MED ORDER — THROMBIN 20000 UNITS EX KIT
PACK | CUTANEOUS | Status: AC
  Filled 2021-04-27: qty 1

## 2021-04-27 MED ORDER — HEMOSTATIC AGENTS (NO CHARGE) OPTIME
TOPICAL | Status: DC | PRN
  Administered 2021-04-27: 1 via TOPICAL

## 2021-04-27 MED ORDER — LACTATED RINGERS IV SOLN: INTRAVENOUS | Status: DC | PRN

## 2021-04-27 MED ORDER — OXYCODONE-ACETAMINOPHEN 5-325 MG PO TABS
ORAL_TABLET | ORAL | Status: AC
  Filled 2021-04-27: qty 2

## 2021-04-27 MED ORDER — PHENYLEPHRINE 40 MCG/ML (10ML) SYRINGE FOR IV PUSH (FOR BLOOD PRESSURE SUPPORT)
PREFILLED_SYRINGE | INTRAVENOUS | Status: AC
  Filled 2021-04-27: qty 10

## 2021-04-27 MED ORDER — FENTANYL CITRATE (PF) 250 MCG/5ML IJ SOLN
INTRAMUSCULAR | Status: AC
  Filled 2021-04-27: qty 5

## 2021-04-27 MED ORDER — PHENYLEPHRINE HCL-NACL 20-0.9 MG/250ML-% IV SOLN
INTRAVENOUS | Status: DC | PRN
Start: 2021-04-27 — End: 2021-04-27
  Administered 2021-04-27: 50 ug/min via INTRAVENOUS

## 2021-04-27 MED ORDER — EPHEDRINE SULFATE-NACL 50-0.9 MG/10ML-% IV SOSY
PREFILLED_SYRINGE | INTRAVENOUS | Status: DC | PRN
  Administered 2021-04-27 (×2): 10 mg via INTRAVENOUS

## 2021-04-27 MED ORDER — 0.9 % SODIUM CHLORIDE (POUR BTL) OPTIME
TOPICAL | Status: DC | PRN
  Administered 2021-04-27: 09:00:00 1000 mL

## 2021-04-27 MED ORDER — LIDOCAINE 2% (20 MG/ML) 5 ML SYRINGE
INTRAMUSCULAR | Status: DC | PRN
  Administered 2021-04-27: 100 mg via INTRAVENOUS

## 2021-04-27 MED ORDER — BUPIVACAINE-EPINEPHRINE (PF) 0.25% -1:200000 IJ SOLN
INTRAMUSCULAR | Status: AC
  Filled 2021-04-27: qty 30

## 2021-04-27 MED ORDER — FENTANYL CITRATE (PF) 100 MCG/2ML IJ SOLN
25.0000 ug | INTRAMUSCULAR | Status: DC | PRN
  Administered 2021-04-27 (×3): 50 ug via INTRAVENOUS

## 2021-04-27 MED ORDER — OXYCODONE HCL 5 MG PO TABS
5.0000 mg | ORAL_TABLET | Freq: Once | ORAL | Status: AC | PRN
  Administered 2021-04-27: 11:00:00 5 mg via ORAL

## 2021-04-27 MED ORDER — ACETAMINOPHEN 325 MG PO TABS
650.0000 mg | ORAL_TABLET | Freq: Four times a day (QID) | ORAL | Status: DC | PRN
  Administered 2021-04-27: 20:00:00 650 mg via ORAL
  Filled 2021-04-27: qty 2

## 2021-04-27 MED ORDER — ACETAMINOPHEN 160 MG/5ML PO SOLN: 1000.0000 mg | Freq: Once | ORAL | Status: DC | PRN

## 2021-04-27 MED ORDER — ONDANSETRON HCL 4 MG/2ML IJ SOLN
INTRAMUSCULAR | Status: AC
  Filled 2021-04-27: qty 2

## 2021-04-27 MED ORDER — OXYCODONE-ACETAMINOPHEN 5-325 MG PO TABS: 1.0000 | ORAL_TABLET | ORAL | 0 refills | Status: DC | PRN

## 2021-04-27 MED ORDER — ROCURONIUM BROMIDE 10 MG/ML (PF) SYRINGE
PREFILLED_SYRINGE | INTRAVENOUS | Status: DC | PRN
  Administered 2021-04-27: 20 mg via INTRAVENOUS
  Administered 2021-04-27: 10 mg via INTRAVENOUS
  Administered 2021-04-27: 80 mg via INTRAVENOUS

## 2021-04-27 MED ORDER — BUPIVACAINE-EPINEPHRINE 0.25% -1:200000 IJ SOLN
INTRAMUSCULAR | Status: DC | PRN
  Administered 2021-04-27: 15 mL
  Administered 2021-04-27: 8 mL

## 2021-04-27 MED ORDER — LIDOCAINE 2% (20 MG/ML) 5 ML SYRINGE
INTRAMUSCULAR | Status: AC
  Filled 2021-04-27: qty 5

## 2021-04-27 MED ORDER — OXYCODONE HCL 5 MG PO TABS
ORAL_TABLET | ORAL | Status: AC
  Filled 2021-04-27: qty 1

## 2021-04-27 MED ORDER — MIDAZOLAM HCL 2 MG/2ML IJ SOLN
INTRAMUSCULAR | Status: AC
  Filled 2021-04-27: qty 2

## 2021-04-27 MED ORDER — FENTANYL CITRATE (PF) 250 MCG/5ML IJ SOLN
INTRAMUSCULAR | Status: DC | PRN
  Administered 2021-04-27: 50 ug via INTRAVENOUS
  Administered 2021-04-27: 100 ug via INTRAVENOUS
  Administered 2021-04-27 (×2): 50 ug via INTRAVENOUS

## 2021-04-27 MED ORDER — BUPIVACAINE LIPOSOME 1.3 % IJ SUSP
INTRAMUSCULAR | Status: AC
  Filled 2021-04-27: qty 20

## 2021-04-27 MED ORDER — ONDANSETRON HCL 4 MG/2ML IJ SOLN
INTRAMUSCULAR | Status: DC | PRN
  Administered 2021-04-27: 4 mg via INTRAVENOUS

## 2021-04-27 MED ORDER — OXYCODONE HCL 5 MG/5ML PO SOLN: 5.0000 mg | Freq: Once | ORAL | Status: AC | PRN

## 2021-04-27 MED ORDER — PHENYLEPHRINE 40 MCG/ML (10ML) SYRINGE FOR IV PUSH (FOR BLOOD PRESSURE SUPPORT)
PREFILLED_SYRINGE | INTRAVENOUS | Status: DC | PRN
  Administered 2021-04-27: 80 ug via INTRAVENOUS
  Administered 2021-04-27 (×2): 120 ug via INTRAVENOUS

## 2021-04-27 MED FILL — Thrombin For Soln Kit 20000 Unit: CUTANEOUS | Qty: 1 | Status: AC

## 2021-04-27 SURGICAL SUPPLY — 90 items
AGENT HMST KT MTR STRL THRMB (HEMOSTASIS)
APL SKNCLS STERI-STRIP NONHPOA (GAUZE/BANDAGES/DRESSINGS) ×1
BAG COUNTER SPONGE SURGICOUNT (BAG) ×2 IMPLANT
BAG SPNG CNTER NS LX DISP (BAG) ×1
BENZOIN TINCTURE PRP APPL 2/3 (GAUZE/BANDAGES/DRESSINGS) ×1 IMPLANT
BLADE CLIPPER SURG (BLADE) IMPLANT
BUR PRESCISION 1.7 ELITE (BURR) IMPLANT
BUR ROUND FLUTED 5 RND (BURR) ×1 IMPLANT
BUR ROUND PRECISION 4.0 (BURR) IMPLANT
BUR SABER RD CUTTING 3.0 (BURR) IMPLANT
CARTRIDGE OIL MAESTRO DRILL (MISCELLANEOUS) ×2 IMPLANT
CNTNR URN SCR LID CUP LEK RST (MISCELLANEOUS) ×1 IMPLANT
CONT SPEC 4OZ STRL OR WHT (MISCELLANEOUS) ×2
COVER SURGICAL LIGHT HANDLE (MISCELLANEOUS) ×2 IMPLANT
DIFFUSER DRILL AIR PNEUMATIC (MISCELLANEOUS) ×4 IMPLANT
DRAIN CHANNEL 15F RND FF W/TCR (WOUND CARE) ×2 IMPLANT
DRAPE C-ARM 42X72 X-RAY (DRAPES) ×2 IMPLANT
DRAPE POUCH INSTRU U-SHP 10X18 (DRAPES) ×2 IMPLANT
DRAPE SURG 17X23 STRL (DRAPES) ×8 IMPLANT
DRSG MEPILEX BORDER 4X12 (GAUZE/BANDAGES/DRESSINGS) IMPLANT
DRSG MEPILEX BORDER 4X8 (GAUZE/BANDAGES/DRESSINGS) IMPLANT
DURAPREP 26ML APPLICATOR (WOUND CARE) ×2 IMPLANT
ELECT BLADE 4.0 EZ CLEAN MEGAD (MISCELLANEOUS) ×2
ELECT CAUTERY BLADE 6.4 (BLADE) ×4 IMPLANT
ELECT REM PT RETURN 9FT ADLT (ELECTROSURGICAL) ×2
ELECTRODE BLDE 4.0 EZ CLN MEGD (MISCELLANEOUS) ×1 IMPLANT
ELECTRODE REM PT RTRN 9FT ADLT (ELECTROSURGICAL) ×1 IMPLANT
EVACUATOR SILICONE 100CC (DRAIN) ×2 IMPLANT
GAUZE 4X4 16PLY ~~LOC~~+RFID DBL (SPONGE) ×4 IMPLANT
GAUZE SPONGE 4X4 12PLY STRL (GAUZE/BANDAGES/DRESSINGS) ×2 IMPLANT
GAUZE SPONGE 4X4 12PLY STRL LF (GAUZE/BANDAGES/DRESSINGS) ×1 IMPLANT
GLOVE SRG 8 PF TXTR STRL LF DI (GLOVE) ×1 IMPLANT
GLOVE SURG ENC MOIS LTX SZ6.5 (GLOVE) ×2 IMPLANT
GLOVE SURG ENC MOIS LTX SZ8 (GLOVE) ×2 IMPLANT
GLOVE SURG UNDER POLY LF SZ7 (GLOVE) ×2 IMPLANT
GLOVE SURG UNDER POLY LF SZ8 (GLOVE) ×2
GOWN STRL REUS W/ TWL LRG LVL3 (GOWN DISPOSABLE) ×2 IMPLANT
GOWN STRL REUS W/ TWL XL LVL3 (GOWN DISPOSABLE) ×1 IMPLANT
GOWN STRL REUS W/TWL LRG LVL3 (GOWN DISPOSABLE) ×4
GOWN STRL REUS W/TWL XL LVL3 (GOWN DISPOSABLE) ×2
GUIDEWIRE SHARP VIPER II (WIRE) ×8 IMPLANT
IV CATH 14GX2 1/4 (CATHETERS) ×2 IMPLANT
KIT BASIN OR (CUSTOM PROCEDURE TRAY) ×2 IMPLANT
KIT POSITION SURG JACKSON T1 (MISCELLANEOUS) ×2 IMPLANT
KIT TURNOVER KIT B (KITS) ×2 IMPLANT
MARKER SKIN DUAL TIP RULER LAB (MISCELLANEOUS) ×2 IMPLANT
NDL HYPO 25GX1X1/2 BEV (NEEDLE) ×1 IMPLANT
NDL SPNL 18GX3.5 QUINCKE PK (NEEDLE) ×2 IMPLANT
NEEDLE HYPO 25GX1X1/2 BEV (NEEDLE) ×2 IMPLANT
NEEDLE SPNL 18GX3.5 QUINCKE PK (NEEDLE) ×4 IMPLANT
NS IRRIG 1000ML POUR BTL (IV SOLUTION) ×2 IMPLANT
OIL CARTRIDGE MAESTRO DRILL (MISCELLANEOUS) ×4
PACK LAMINECTOMY ORTHO (CUSTOM PROCEDURE TRAY) ×2 IMPLANT
PACK UNIVERSAL I (CUSTOM PROCEDURE TRAY) ×2 IMPLANT
PAD ARMBOARD 7.5X6 YLW CONV (MISCELLANEOUS) ×4 IMPLANT
PATTIES SURGICAL .5 X1 (DISPOSABLE) ×2 IMPLANT
PATTIES SURGICAL .5 X3 (DISPOSABLE) IMPLANT
PATTIES SURGICAL .5X1.5 (GAUZE/BANDAGES/DRESSINGS) ×2 IMPLANT
PATTIES SURGICAL .75X.75 (GAUZE/BANDAGES/DRESSINGS) ×2 IMPLANT
PUTTY DBX 1CC (Putty) ×2 IMPLANT
PUTTY DBX 1CC DEPUY (Putty) IMPLANT
ROD VIPER2 5.5X65MM (Rod) ×1 IMPLANT
ROD VIPOR2 70MM PRE LARDOSED (Rod) ×1 IMPLANT
SCREW SET SINGLE INNER MIS (Screw) ×6 IMPLANT
SCREW XTAB POLY VIPER  6X45 (Screw) ×2 IMPLANT
SCREW XTAB POLY VIPER  7X45 (Screw) ×10 IMPLANT
SCREW XTAB POLY VIPER 6X45 (Screw) IMPLANT
SCREW XTAB POLY VIPER 7X45 (Screw) IMPLANT
SPONGE INTESTINAL PEANUT (DISPOSABLE) IMPLANT
SPONGE SURGIFOAM ABS GEL 100 (HEMOSTASIS) IMPLANT
STRIP CLOSURE SKIN 1/2X4 (GAUZE/BANDAGES/DRESSINGS) IMPLANT
SURGIFLO W/THROMBIN 8M KIT (HEMOSTASIS) IMPLANT
SUT MNCRL AB 4-0 PS2 18 (SUTURE) ×2 IMPLANT
SUT VIC AB 0 CT1 18XCR BRD 8 (SUTURE) ×2 IMPLANT
SUT VIC AB 0 CT1 8-18 (SUTURE) ×4
SUT VIC AB 1 CT1 18XCR BRD 8 (SUTURE) ×2 IMPLANT
SUT VIC AB 1 CT1 8-18 (SUTURE) ×6
SUT VIC AB 2-0 CT2 18 VCP726D (SUTURE) ×4 IMPLANT
SYR 20ML LL LF (SYRINGE) ×2 IMPLANT
SYR BULB IRRIG 60ML STRL (SYRINGE) ×2 IMPLANT
SYR CONTROL 10ML LL (SYRINGE) ×4 IMPLANT
SYR TB 1ML LUER SLIP (SYRINGE) ×2 IMPLANT
TAP CANN VIPER2 DL 6.0 (TAP) ×2 IMPLANT
TAP CANN VIPER2 DL 7.0 (TAP) ×2 IMPLANT
TAPE CLOTH SURG 6X10 WHT LF (GAUZE/BANDAGES/DRESSINGS) ×1 IMPLANT
TOWEL GREEN STERILE (TOWEL DISPOSABLE) ×2 IMPLANT
TOWEL GREEN STERILE FF (TOWEL DISPOSABLE) ×2 IMPLANT
TRAY FOLEY MTR SLVR 16FR STAT (SET/KITS/TRAYS/PACK) ×2 IMPLANT
WATER STERILE IRR 1000ML POUR (IV SOLUTION) ×2 IMPLANT
YANKAUER SUCT BULB TIP NO VENT (SUCTIONS) ×2 IMPLANT

## 2021-04-27 NOTE — Anesthesia Postprocedure Evaluation (Signed)
Anesthesia Post Note  Patient: RODERIC LAMMERT  Procedure(s) Performed: LUMBAR 4- LUMBAR 5, LUMBAR 5- SACRUM 1 POSTERIOR SPINAL FUSION WITH INSTRUMENTATION AND ALLOGRAFT (Spine Lumbar)     Patient location during evaluation: PACU Anesthesia Type: General Level of consciousness: awake and alert Pain management: pain level controlled Vital Signs Assessment: post-procedure vital signs reviewed and stable Respiratory status: spontaneous breathing, nonlabored ventilation, respiratory function stable and patient connected to nasal cannula oxygen Cardiovascular status: blood pressure returned to baseline and stable Postop Assessment: no apparent nausea or vomiting Anesthetic complications: no   No notable events documented.  Last Vitals:  Vitals:   04/27/21 1140 04/27/21 1206  BP: 100/65 (!) 90/57  Pulse: 70 64  Resp: 13 18  Temp: 36.6 C   SpO2: 99% 97%    Last Pain:  Vitals:   04/27/21 1140  TempSrc:   PainSc: Asleep                 Ladeja Pelham

## 2021-04-27 NOTE — H&P (Signed)
Patient tolerated stage 1 of his procedure yesterday and presents for stage 2.  Will proceed as scheduled.

## 2021-04-27 NOTE — Transfer of Care (Signed)
Immediate Anesthesia Transfer of Care Note  Patient: Tony Frye  Procedure(s) Performed: LUMBAR 4- LUMBAR 5, LUMBAR 5- SACRUM 1 POSTERIOR SPINAL FUSION WITH INSTRUMENTATION AND ALLOGRAFT (Spine Lumbar)  Patient Location: PACU  Anesthesia Type:General  Level of Consciousness: awake  Airway & Oxygen Therapy: Patient Spontanous Breathing  Post-op Assessment: Report given to RN and Post -op Vital signs reviewed and stable  Post vital signs: Reviewed and stable  Last Vitals:  Vitals Value Taken Time  BP 95/44 04/27/21 1055  Temp    Pulse 72 04/27/21 1057  Resp 16 04/27/21 1057  SpO2 97 % 04/27/21 1057  Vitals shown include unvalidated device data.  Last Pain:  Vitals:   04/27/21 0344  TempSrc: Oral  PainSc:       Patients Stated Pain Goal: 2 (23/01/72 0910)  Complications: No notable events documented.

## 2021-04-27 NOTE — Op Note (Signed)
PATIENT NAME: Tony Frye   MEDICAL RECORD NO.:   [id number]    DATE OF BIRTH: 30-Sep-1960   DATE OF PROCEDURE: 04/27/2021                                OPERATIVE REPORT     PREOPERATIVE DIAGNOSES: 1.  Status post L4-5 and L5-S1 anterior lumbar fusion on 04/26/2021, requiring posterior fusion with instrumentation   POSTOPERATIVE DIAGNOSES:   1.  Status post L4-5 and L5-S1 anterior lumbar fusion on 04/26/2021, requiring posterior fusion with instrumentation     PROCEDURE (Stage 2): 1. Posterior spinal fusion, L4-5, L5-S1. 2. Placement of posterior segmental instrumentation, L4, L5, S1 bilaterally. 3. Use of morselized allograft - DBX putty . 4. Intraoperative use of floroscopy   SURGEON:  Phylliss Bob, MD   ASSISTANT:  Lowell Guitar. Mancel Bale, PA-C   ANESTHESIA:  General endotracheal anesthesia.   COMPLICATIONS:  None.   DISPOSITION:  Stable.   ESTIMATED BLOOD LOSS:  Minimal   INDICATIONS FOR SURGERY: Briefly, [name] is one day status post an anterior lumbar fusion as noted above.  Please refer to my operative report dated 04/26/2021, for a full account of the patient's preoperative history and indications for surgery.  The patient did present today for stage 2 of what was to be a 2-staged procedure.   OPERATIVE DETAILS:  On 04/27/2021 , the patient was brought to surgery and general endotracheal anesthesia was administered.  The patient was placed prone onto a Jackson spinal bed.  The back was then prepped and draped in the usual sterile fashion.  I then made paramedian incisions on the right and left sides, just lateral to the lateral borders of the pedicles at L4-5 and L5-S1.  On the left side, the posterolateral gutter and posterior elements at the L4-5 and L5-S1 levels were identified and exposed and decorticated.  DBX putty was packed into the posterolateral gutter on the left side to aid in the success of the posterior fusion.  I then tapped the L4, L5, and S1  pedicles bilaterally using a 6 mm tap.  I then placed 7 x 40 mm screws bilaterally at S1 and 7 x 40 mm screws bilaterally at L4 and L5.  Rods were then secured into the tulip heads of the screws bilaterally.  Caps were then placed and a final locking procedure was performed.  I was very pleased with the final AP and lateral fluoroscopic images.  The wound was then copiously irrigated.  On the right and left sides, the fascia was closed using #1 Vicryl.  The subcutaneous layer was closed using 0 Vicryl followed by 2-0 Vicryl, and the skin was then closed using 4-0 Monocryl. Benzoin and Steri-Strips were applied followed by sterile dressing.  All instrument counts were correct at the termination of the procedure.    Of note, [name], was my assistant throughout surgery, and did aid in suctioning, retraction, placement of the hardware, and closure.   Phylliss Bob, MD

## 2021-04-27 NOTE — Anesthesia Preprocedure Evaluation (Signed)
Anesthesia Evaluation  Patient identified by MRN, date of birth, ID band Patient awake    Reviewed: Allergy & Precautions, NPO status , Patient's Chart, lab work & pertinent test results  History of Anesthesia Complications Negative for: history of anesthetic complications  Airway Mallampati: II  TM Distance: >3 FB Neck ROM: Full    Dental  (+) Dental Advisory Given, Teeth Intact   Pulmonary neg shortness of breath, sleep apnea and Continuous Positive Airway Pressure Ventilation , neg COPD, former smoker,    breath sounds clear to auscultation       Cardiovascular hypertension, Pt. on medications (-) angina(-) Past MI and (-) CHF (-) dysrhythmias  Rhythm:Regular     Neuro/Psych PSYCHIATRIC DISORDERS Depression  Neuromuscular disease    GI/Hepatic Neg liver ROS, hiatal hernia,   Endo/Other  negative endocrine ROS  Renal/GU negative Renal ROS     Musculoskeletal  (+) Arthritis ,   Abdominal   Peds  Hematology negative hematology ROS (+) Lab Results      Component                Value               Date                      WBC                      6.4                 04/24/2021                HGB                      14.3                04/24/2021                HCT                      43.6                04/24/2021                MCV                      88.4                04/24/2021                PLT                      219                 04/24/2021              Anesthesia Other Findings   Reproductive/Obstetrics                             Anesthesia Physical Anesthesia Plan  ASA: 2  Anesthesia Plan: General   Post-op Pain Management:    Induction: Intravenous  PONV Risk Score and Plan: 2 and Ondansetron and Dexamethasone  Airway Management Planned: Oral ETT  Additional Equipment: None  Intra-op Plan:   Post-operative Plan: Extubation in OR  Informed Consent: I have  reviewed the patients History and Physical, chart, labs and  discussed the procedure including the risks, benefits and alternatives for the proposed anesthesia with the patient or authorized representative who has indicated his/her understanding and acceptance.     Dental advisory given  Plan Discussed with: CRNA and Anesthesiologist  Anesthesia Plan Comments:         Anesthesia Quick Evaluation

## 2021-04-27 NOTE — Progress Notes (Signed)
[facility]  Subjective  -no complaints.  States slept well last night.   Objective 111/63 62 98.4 F (36.9 C) (Oral) 20 93%  Intake/Output Summary (Last 24 hours) at 04/27/2021 0808 Last data filed at 04/27/2021 0000 Gross per 24 hour  Intake 2400 ml  Output 4300 ml  Net -1900 ml    Abdomen with appropriate postop incisional tenderness Left PT palpable  Laboratory Lab Results: Recent Labs    04/24/21 1100  WBC 6.4  HGB 14.3  HCT 43.6  PLT 219   BMET Recent Labs    04/24/21 1100  NA 140  K 4.8  CL 104  CO2 28  GLUCOSE 103*  BUN 15  CREATININE 1.00  CALCIUM 9.8    COAG Lab Results  Component Value Date   INR 1.0 04/24/2021   INR 0.97 03/27/2010   No results found for: PTT  Assessment/Planning:  Postop day 1 status post anterior spine exposure at L4-L5 and L5-S1 for two-level ALIF.  Seen in preop this morning with plans for stage 2 intervention with Dr. Lynann Bologna today.  Abdomen with appropriate postop incisional tenderness.  He looks really good.  Palpable PT pulse in the left foot.  Please call vascular if questions or concerns  Marty Heck 04/27/2021 8:08 AM --

## 2021-04-27 NOTE — Anesthesia Procedure Notes (Addendum)
Procedure Name: Intubation Date/Time: 04/27/2021 8:04 AM Performed by: Vonna Drafts, CRNA Pre-anesthesia Checklist: Patient identified, Emergency Drugs available, Suction available and Patient being monitored Patient Re-evaluated:Patient Re-evaluated prior to induction Oxygen Delivery Method: Circle system utilized Preoxygenation: Pre-oxygenation with 100% oxygen Induction Type: IV induction Ventilation: Oral airway inserted - appropriate to patient size and Two handed mask ventilation required Laryngoscope Size: Mac and 4 Grade View: Grade I Tube type: Oral Tube size: 7.5 mm Number of attempts: 1 Airway Equipment and Method: Stylet and Oral airway Placement Confirmation: ETT inserted through vocal cords under direct vision, positive ETCO2 and breath sounds checked- equal and bilateral Secured at: 24 cm Tube secured with: Tape Dental Injury: Teeth and Oropharynx as per pre-operative assessment

## 2021-04-27 NOTE — Progress Notes (Signed)
Pt left to OR. Report given to Melinda Crutch CRNA. V/S stable. No c/o of any discomfort. Transported to OR without difficulty.

## 2021-04-27 NOTE — Anesthesia Postprocedure Evaluation (Signed)
Anesthesia Post Note  Patient: YASHUA BRACCO  Procedure(s) Performed: ANTERIOR LUMBAR INTERBODY FUSION LUMBAR 4- LUMBAR 5, LUMBAR 5- SACRUM 1 WITH INSTRUMENTATION AND ALLOGRAFT (Abdomen) ABDOMINAL EXPOSURE (Abdomen)     Patient location during evaluation: PACU Anesthesia Type: General Level of consciousness: awake Pain management: pain level controlled Vital Signs Assessment: post-procedure vital signs reviewed and stable Respiratory status: spontaneous breathing and respiratory function stable Cardiovascular status: stable Postop Assessment: no apparent nausea or vomiting Anesthetic complications: no   No notable events documented.  Last Vitals:  Vitals:   04/26/21 2259 04/27/21 0344  BP: 134/70 111/63  Pulse: 79 62  Resp: 20 20  Temp: 37.2 C 36.9 C  SpO2: 96% 93%    Last Pain:  Vitals:   04/27/21 0344  TempSrc: Oral  PainSc:                  Merlinda Frederick

## 2021-04-27 NOTE — Progress Notes (Addendum)
Patient has elevated temperature at 103F despite use of incentive spirometer, ambulation, and administration of PRN tylenol and percocet.  Latest oral temp was 102.9 F, pt. complained of burning when urinating and informed this RN that he had a hx of UTI during his past knee surgery. On call Kerry Hough. Hardin Negus of [facility] was made aware and ordered for urinalysis, chest xray, blood culture.  Urine sample obtained and chest xray was recently done, awaiting for phlebotomist.  Encouraged to use IS when awake. Will closely monitor.

## 2021-04-28 ENCOUNTER — Inpatient Hospital Stay (HOSPITAL_COMMUNITY): Payer: BC Managed Care – PPO

## 2021-04-28 LAB — URINALYSIS, ROUTINE W REFLEX MICROSCOPIC
Bacteria, UA: NONE SEEN
Bilirubin Urine: NEGATIVE
Glucose, UA: NEGATIVE mg/dL
Ketones, ur: NEGATIVE mg/dL
Leukocytes,Ua: NEGATIVE
Nitrite: NEGATIVE
Protein, ur: NEGATIVE mg/dL
Specific Gravity, Urine: 1.009 (ref 1.005–1.030)
pH: 6 (ref 5.0–8.0)

## 2021-04-28 LAB — CBC
HCT: 35.5 % — ABNORMAL LOW (ref 39.0–52.0)
Hemoglobin: 11.6 g/dL — ABNORMAL LOW (ref 13.0–17.0)
MCH: 29 pg (ref 26.0–34.0)
MCHC: 32.7 g/dL (ref 30.0–36.0)
MCV: 88.8 fL (ref 80.0–100.0)
Platelets: 170 10*3/uL (ref 150–400)
RBC: 4 MIL/uL — ABNORMAL LOW (ref 4.22–5.81)
RDW: 14.8 % (ref 11.5–15.5)
WBC: 9.5 10*3/uL (ref 4.0–10.5)
nRBC: 0 % (ref 0.0–0.2)

## 2021-04-28 MED FILL — Thrombin For Soln Kit 20000 Unit: CUTANEOUS | Qty: 1 | Status: AC

## 2021-04-28 NOTE — Progress Notes (Signed)
    Patient doing well  Had temp overnight, which has resolved Has been ambulating   Physical Exam: Vitals:   04/28/21 0000 04/28/21 0404  BP:  122/62  Pulse:  73  Resp:  20  Temp: (!) 102.1 F (38.9 C) 99.7 F (37.6 C)  SpO2:  95%    Dressing in place NVI  Pt s/p A/P L4-S1 fusion  - fever likely atelectasis and is resolving - up with PT/OT, encourage ambulation - Percocet for pain, Robaxin for muscle spasms - likely d/c home today with f/u in 2 weeks

## 2021-04-28 NOTE — Plan of Care (Signed)
Patient alert and oriented, mae's well, voiding adequate amount of urine, swallowing without difficulty, no c/o pain at time of discharge. Patient discharged home with family. Script and discharged instructions given to patient. Patient and family stated understanding of instructions given. Patient has an appointment with Dr. Dumonski 

## 2021-04-28 NOTE — Evaluation (Signed)
Occupational Therapy Evaluation Patient Details Name: Tony Frye MRN: [id number] DOB: [birth year] Today's Date: 04/28/2021   History of Present Illness The pt is a 60 yo male presenting 11/16 for ALIF L4-5 and L5-S1 with anterior approach, followed by posterior fusion of L4-5 and L5-S1 on 11/17. Pt with post-op fever, but no other compliations. PMH includes: cancer, depression, HTN, OSA on CPAP, and bilateral TKA.   Clinical Impression   Pt admitted for procedure listed above. PTA pt reported that he was independent with all ADL's and IADL's, including functional mobility with no DME. At this time, pt presents with increased weakness in BLE, and mild balance concerns, requiring use of RW for transfers and ambulation. Pt able to complete all basic ADL's with increased time and compensatory strategies. He recalls 3/3 spinal precautions and has no further OT needs. Acute OT will sign off.       Recommendations for follow up therapy are one component of a multi-disciplinary discharge planning process, led by the attending physician.  Recommendations may be updated based on patient status, additional functional criteria and insurance authorization.   Follow Up Recommendations  No OT follow up    Assistance Recommended at Discharge PRN  Functional Status Assessment  Patient has had a recent decline in their functional status and demonstrates the ability to make significant improvements in function in a reasonable and predictable amount of time.  Equipment Recommendations  None recommended by OT    Recommendations for Other Services       Precautions / Restrictions Precautions Precautions: Back Precaution Booklet Issued: Yes (comment) Precaution Comments: Reviewed spinal precautions and compensatory strategies. Required Braces or Orthoses: Spinal Brace Spinal Brace: Thoracolumbosacral orthotic Restrictions Weight Bearing Restrictions: No      Mobility Bed Mobility Overal bed  mobility: Modified Independent Bed Mobility: Rolling;Sidelying to sit         General bed mobility comments: Reviewed log roll, pt able to complete with increased time and bed rails, pt reports that he will be sleeping in a recliner at home.    Transfers Overall transfer level: Needs assistance Equipment used: Rolling walker (2 wheels) Transfers: Sit to/from Stand Sit to Stand: Min guard           General transfer comment: Pt able to come to standing with a lot of effort and min guard for safety      Balance                                           ADL either performed or assessed with clinical judgement   ADL Overall ADL's : Modified independent                                       General ADL Comments: PT able to complete basic ADL's with no assist safely     Vision Baseline Vision/History: 1 Wears glasses Ability to See in Adequate Light: 0 Adequate Patient Visual Report: No change from baseline Vision Assessment?: No apparent visual deficits     Perception     Praxis      Pertinent Vitals/Pain Pain Assessment: Faces Faces Pain Scale: Hurts little more Pain Location: Back and abdomen Pain Descriptors / Indicators: Aching;Discomfort;Grimacing Pain Intervention(s): Limited activity within patient's tolerance;Monitored during session;Repositioned     Hand  Dominance Right   Extremity/Trunk Assessment Upper Extremity Assessment Upper Extremity Assessment: Overall WFL for tasks assessed   Lower Extremity Assessment Lower Extremity Assessment: Overall WFL for tasks assessed   Cervical / Trunk Assessment Cervical / Trunk Assessment: Back Surgery   Communication Communication Communication: No difficulties   Cognition Arousal/Alertness: Awake/alert Behavior During Therapy: WFL for tasks assessed/performed Overall Cognitive Status: Within Functional Limits for tasks assessed                                        General Comments  Bandages appear mildly soiled, RN notified.    Exercises     Shoulder Instructions      Home Living Family/patient expects to be discharged to:: Private residence Living Arrangements: Spouse/significant other Available Help at Discharge: Family Type of Home: House Home Access: Stairs to enter Technical brewer of Steps: 1 step Entrance Stairs-Rails: None Home Layout: One level     Bathroom Shower/Tub: Occupational psychologist: Handicapped height Bathroom Accessibility: Yes How Accessible: Accessible via walker Home Equipment: Conservation officer, nature (2 wheels);BSC/3in1   Additional Comments: Uses BSC as [place]      Prior Functioning/Environment Prior Level of Function : Independent/Modified Independent;Driving             Mobility Comments: Used no DME ADLs Comments: Independent        OT Problem List: Decreased strength;Decreased activity tolerance;Impaired balance (sitting and/or standing);Decreased knowledge of use of DME or AE      OT Treatment/Interventions:      OT Goals(Current goals can be found in the care plan section) Acute Rehab OT Goals Patient Stated Goal: To get back to indepednence OT Goal Formulation: With patient Time For Goal Achievement: 04/28/21 Potential to Achieve Goals: Good  OT Frequency:     Barriers to D/C:            Co-evaluation              AM-PAC OT "6 Clicks" Daily Activity     Outcome Measure Help from another person eating meals?: None Help from another person taking care of personal grooming?: None Help from another person toileting, which includes using toliet, bedpan, or urinal?: A Little Help from another person bathing (including washing, rinsing, drying)?: None Help from another person to put on and taking off regular upper body clothing?: None Help from another person to put on and taking off regular lower body clothing?: None 6 Click Score: 23   End of Session  Equipment Utilized During Treatment: Rolling walker (2 wheels);Back brace Nurse Communication: Mobility status  Activity Tolerance: Patient tolerated treatment well Patient left: Other (comment) (Up in bathroom, PT coming in)  OT Visit Diagnosis: Unsteadiness on feet (R26.81);Other abnormalities of gait and mobility (R26.89);Muscle weakness (generalized) (M62.81)                Time: 0109-3235 OT Time Calculation (min): 25 min Charges:  OT General Charges $OT Visit: 1 Visit OT Evaluation $OT Eval Low Complexity: 1 Low OT Treatments $Self Care/Home Management : 8-22 mins  Darris Staiger H., OTR/L Acute Rehabilitation  [place] Elane Lealon [place]putten 04/28/2021, 9:16 AM

## 2021-04-28 NOTE — Evaluation (Signed)
Physical Therapy Evaluation Patient Details Name: Tony Frye MRN: [id number] DOB: [birth year] Today's Date: 04/28/2021  History of Present Illness  The pt is a 60 yo male presenting 11/16 for ALIF L4-5 and L5-S1 with anterior approach, followed by posterior fusion of L4-5 and L5-S1 on 11/17. Pt with post-op fever, but no other compliations. PMH includes: cancer, depression, HTN, OSA on CPAP, and bilateral TKA.   Clinical Impression  Pt OOB ambulating in room upon arrival of PT, agreeable to evaluation at this time. Prior to admission the pt was independent without need for DME prior to admission, but was progressively limited by pain. The pt now presents with limitations in functional mobility, strength and power in BLE, and dynamic stability due to above dx and resulting pain. However, the pt is mobilizing generally on supervision/modI level with use of RW at this time and will be safe to return home with family assist once medically cleared. The pt was able to complete multiple sit-stand transfers with cues for technique and increased hip flexion to improve safety and mechanics of transfers. He also completed 300 ft of gait with reduced dependence on UE support when given cues. We discussed role of core strengthening after being cleared by MD to return to exercise, but will defer any OPPT needs to MD discretion. No further acute PT needs at this time, thank you for the consult.         Recommendations for follow up therapy are one component of a multi-disciplinary discharge planning process, led by the attending physician.  Recommendations may be updated based on patient status, additional functional criteria and insurance authorization.  Follow Up Recommendations Follow physician's recommendations for discharge plan and follow up therapies (we discussed possibly OPPT once cleared for core stabilizing and progressive return to exercising.)    Assistance Recommended at Discharge Intermittent  Supervision/Assistance  Functional Status Assessment Patient has had a recent decline in their functional status and demonstrates the ability to make significant improvements in function in a reasonable and predictable amount of time.  Equipment Recommendations  None recommended by PT (pt has needed DME)    Recommendations for Other Services       Precautions / Restrictions Precautions Precautions: Back Precaution Booklet Issued: Yes (comment) Precaution Comments: Reviewed spinal precautions and compensatory strategies. Required Braces or Orthoses: Spinal Brace Spinal Brace: Thoracolumbosacral orthotic Restrictions Weight Bearing Restrictions: No      Mobility  Bed Mobility Overal bed mobility: Modified Independent Bed Mobility: Rolling;Sit to Sidelying Rolling: Supervision       Sit to sidelying: Min assist General bed mobility comments: Reviewed log roll, pt able to complete with increased time and bed rails, pt reports that he will be sleeping in a recliner at home.    Transfers Overall transfer level: Needs assistance Equipment used: Rolling walker (2 wheels) Transfers: Sit to/from Stand Sit to Stand: Min guard           General transfer comment: Pt able to come to standing with a lot of effort and min guard for safety    Ambulation/Gait Ambulation/Gait assistance: Supervision Gait Distance (Feet): 300 Feet Assistive device: Rolling walker (2 wheels) Gait Pattern/deviations: WFL(Within Functional Limits);Trunk flexed Gait velocity: 0.4 m/s Gait velocity interpretation: <1.31 ft/sec, indicative of household ambulator   General Gait Details: heavy reliance on BUE initially with pt cued to relax shoulders and increase wt through legs rather than UE. no LOB, slow but steady gait      Balance Overall balance assessment: Mild  deficits observed, not formally tested                                           Pertinent Vitals/Pain Pain  Assessment: Faces Faces Pain Scale: Hurts little more Pain Location: Back and abdomen Pain Descriptors / Indicators: Aching;Discomfort;Grimacing Pain Intervention(s): Limited activity within patient's tolerance;Monitored during session;Repositioned    Home Living Family/patient expects to be discharged to:: Private residence Living Arrangements: Spouse/significant other Available Help at Discharge: Family Type of Home: House Home Access: Stairs to enter Entrance Stairs-Rails: None Entrance Stairs-Number of Steps: 1 step   Home Layout: One level Home Equipment: Conservation officer, nature (2 wheels);BSC/3in1 Additional Comments: Uses BSC as [place]    Prior Function Prior Level of Function : Independent/Modified Independent;Driving             Mobility Comments: Used no DME ADLs Comments: Independent     Hand Dominance   Dominant Hand: Right    Extremity/Trunk Assessment   Upper Extremity Assessment Upper Extremity Assessment: Overall WFL for tasks assessed    Lower Extremity Assessment Lower Extremity Assessment: Overall WFL for tasks assessed    Cervical / Trunk Assessment Cervical / Trunk Assessment: Back Surgery  Communication   Communication: No difficulties  Cognition Arousal/Alertness: Awake/alert Behavior During Therapy: WFL for tasks assessed/performed Overall Cognitive Status: Within Functional Limits for tasks assessed                                          General Comments General comments (skin integrity, edema, etc.): VSS on RA        Assessment/Plan    PT Assessment Patient does not need any further PT services  PT Problem List         PT Treatment Interventions      PT Goals (Current goals can be found in the Care Plan section)  Acute Rehab PT Goals Patient Stated Goal: be able to walk around sotres without having to stop due to pain PT Goal Formulation: With patient Time For Goal Achievement: 05/11/21 Potential to Achieve  Goals: Good     AM-PAC PT "6 Clicks" Mobility  Outcome Measure Help needed turning from your back to your side while in a flat bed without using bedrails?: A Little Help needed moving from lying on your back to sitting on the side of a flat bed without using bedrails?: A Little Help needed moving to and from a bed to a chair (including a wheelchair)?: A Little Help needed standing up from a chair using your arms (e.g., wheelchair or bedside chair)?: A Little Help needed to walk in hospital room?: None Help needed climbing 3-5 steps with a railing? : A Little 6 Click Score: 19    End of Session Equipment Utilized During Treatment: Gait belt;Back brace Activity Tolerance: Patient tolerated treatment well Patient left: in bed;with call bell/phone within reach Nurse Communication: Mobility status PT Visit Diagnosis: Unsteadiness on feet (R26.81);Other abnormalities of gait and mobility (R26.89);Pain    Time: 0272-5366 PT Time Calculation (min) (ACUTE ONLY): 21 min   Charges:   PT Evaluation $PT Eval Low Complexity: 1 Low          [place] Carbo, PT, DPT   Acute Rehabilitation Department Pager #: [phone]  Sandra Cockayne  04/28/2021, 9:19 AM

## 2021-04-28 NOTE — Progress Notes (Signed)
All VS are now normal but still emphasize to patient to do IS while awake.  Patient was informed that Urinalysis and CBC are normal but chest xray showed mild bibasilar atelectasis.

## 2021-05-01 ENCOUNTER — Encounter (HOSPITAL_COMMUNITY): Payer: Self-pay | Admitting: Orthopedic Surgery

## 2021-05-02 ENCOUNTER — Encounter (HOSPITAL_COMMUNITY): Payer: Self-pay | Admitting: Orthopedic Surgery

## 2021-05-02 MED FILL — Sodium Chloride IV Soln 0.9%: INTRAVENOUS | Qty: 1000 | Status: AC

## 2021-05-02 MED FILL — Heparin Sodium (Porcine) Inj 1000 Unit/ML: INTRAMUSCULAR | Qty: 30 | Status: AC

## 2021-05-03 ENCOUNTER — Emergency Department (HOSPITAL_BASED_OUTPATIENT_CLINIC_OR_DEPARTMENT_OTHER)
Admission: EM | Admit: 2021-05-03 | Discharge: 2021-05-03 | Disposition: A | Discharge status: Home / Self Care | Payer: BC Managed Care – PPO | Attending: Emergency Medicine | Admitting: Emergency Medicine

## 2021-05-03 ENCOUNTER — Other Ambulatory Visit: Payer: Self-pay

## 2021-05-03 ENCOUNTER — Encounter (HOSPITAL_BASED_OUTPATIENT_CLINIC_OR_DEPARTMENT_OTHER): Payer: Self-pay

## 2021-05-03 ENCOUNTER — Emergency Department (HOSPITAL_BASED_OUTPATIENT_CLINIC_OR_DEPARTMENT_OTHER): Payer: BC Managed Care – PPO

## 2021-05-03 DIAGNOSIS — M4326 Fusion of spine, lumbar region: Secondary | ICD-10-CM | POA: Insufficient documentation

## 2021-05-03 DIAGNOSIS — Z20822 Contact with and (suspected) exposure to covid-19: Secondary | ICD-10-CM | POA: Insufficient documentation

## 2021-05-03 DIAGNOSIS — R0602 Shortness of breath: Secondary | ICD-10-CM | POA: Insufficient documentation

## 2021-05-03 DIAGNOSIS — Z981 Arthrodesis status: Secondary | ICD-10-CM

## 2021-05-03 DIAGNOSIS — Z85828 Personal history of other malignant neoplasm of skin: Secondary | ICD-10-CM | POA: Insufficient documentation

## 2021-05-03 DIAGNOSIS — Z87891 Personal history of nicotine dependence: Secondary | ICD-10-CM | POA: Insufficient documentation

## 2021-05-03 DIAGNOSIS — Z96653 Presence of artificial knee joint, bilateral: Secondary | ICD-10-CM | POA: Insufficient documentation

## 2021-05-03 DIAGNOSIS — I10 Essential (primary) hypertension: Secondary | ICD-10-CM | POA: Diagnosis not present

## 2021-05-03 DIAGNOSIS — K449 Diaphragmatic hernia without obstruction or gangrene: Secondary | ICD-10-CM | POA: Diagnosis not present

## 2021-05-03 DIAGNOSIS — J9811 Atelectasis: Secondary | ICD-10-CM | POA: Diagnosis not present

## 2021-05-03 DIAGNOSIS — R109 Unspecified abdominal pain: Secondary | ICD-10-CM | POA: Diagnosis not present

## 2021-05-03 LAB — CBC WITH DIFFERENTIAL/PLATELET
Abs Immature Granulocytes: 0.04 10*3/uL (ref 0.00–0.07)
Basophils Absolute: 0.1 10*3/uL (ref 0.0–0.1)
Basophils Relative: 1 %
Eosinophils Absolute: 0.5 10*3/uL (ref 0.0–0.5)
Eosinophils Relative: 5 %
HCT: 34 % — ABNORMAL LOW (ref 39.0–52.0)
Hemoglobin: 11.2 g/dL — ABNORMAL LOW (ref 13.0–17.0)
Immature Granulocytes: 1 %
Lymphocytes Relative: 12 %
Lymphs Abs: 1.1 10*3/uL (ref 0.7–4.0)
MCH: 28.9 pg (ref 26.0–34.0)
MCHC: 32.9 g/dL (ref 30.0–36.0)
MCV: 87.6 fL (ref 80.0–100.0)
Monocytes Absolute: 0.7 10*3/uL (ref 0.1–1.0)
Monocytes Relative: 8 %
Neutro Abs: 6.5 10*3/uL (ref 1.7–7.7)
Neutrophils Relative %: 73 %
Platelets: 282 10*3/uL (ref 150–400)
RBC: 3.88 MIL/uL — ABNORMAL LOW (ref 4.22–5.81)
RDW: 15.2 % (ref 11.5–15.5)
WBC: 8.9 10*3/uL (ref 4.0–10.5)
nRBC: 0 % (ref 0.0–0.2)

## 2021-05-03 LAB — CULTURE, BLOOD (ROUTINE X 2)
Culture: NO GROWTH
Culture: NO GROWTH
Special Requests: ADEQUATE
Special Requests: ADEQUATE

## 2021-05-03 LAB — COMPREHENSIVE METABOLIC PANEL
ALT: 33 U/L (ref 0–44)
AST: 29 U/L (ref 15–41)
Albumin: 3.9 g/dL (ref 3.5–5.0)
Alkaline Phosphatase: 83 U/L (ref 38–126)
Anion gap: 8 (ref 5–15)
BUN: 18 mg/dL (ref 8–23)
CO2: 27 mmol/L (ref 22–32)
Calcium: 9.5 mg/dL (ref 8.9–10.3)
Chloride: 98 mmol/L (ref 98–111)
Creatinine, Ser: 1.06 mg/dL (ref 0.61–1.24)
GFR, Estimated: >60 mL/min (ref >60)
Glucose, Bld: 112 mg/dL — ABNORMAL HIGH (ref 70–99)
Potassium: 4.1 mmol/L (ref 3.5–5.1)
Sodium: 133 mmol/L — ABNORMAL LOW (ref 135–145)
Total Bilirubin: 0.9 mg/dL (ref 0.3–1.2)
Total Protein: 7.3 g/dL (ref 6.5–8.1)

## 2021-05-03 LAB — I-STAT VENOUS BLOOD GAS, ED
Acid-Base Excess: 3 mmol/L — ABNORMAL HIGH (ref 0.0–2.0)
Bicarbonate: 29.2 mmol/L — ABNORMAL HIGH (ref 20.0–28.0)
Calcium, Ion: 1.23 mmol/L (ref 1.15–1.40)
HCT: 34 % — ABNORMAL LOW (ref 39.0–52.0)
Hemoglobin: 11.6 g/dL — ABNORMAL LOW (ref 13.0–17.0)
O2 Saturation: 26 %
Patient temperature: 98.1
Potassium: 4.3 mmol/L (ref 3.5–5.1)
Sodium: 138 mmol/L (ref 135–145)
TCO2: 31 mmol/L (ref 22–32)
pCO2, Ven: 48.2 mmHg (ref 44.0–60.0)
pH, Ven: 7.389 (ref 7.250–7.430)
pO2, Ven: 18 mmHg — CL (ref 32.0–45.0)

## 2021-05-03 LAB — URINALYSIS, ROUTINE W REFLEX MICROSCOPIC
Bilirubin Urine: NEGATIVE
Glucose, UA: NEGATIVE mg/dL
Hgb urine dipstick: NEGATIVE
Ketones, ur: NEGATIVE mg/dL
Leukocytes,Ua: NEGATIVE
Nitrite: NEGATIVE
Protein, ur: NEGATIVE mg/dL
Specific Gravity, Urine: 1.009 (ref 1.005–1.030)
pH: 6 (ref 5.0–8.0)

## 2021-05-03 LAB — BRAIN NATRIURETIC PEPTIDE: B Natriuretic Peptide: 21.5 pg/mL (ref 0.0–100.0)

## 2021-05-03 LAB — PROTIME-INR
INR: 1.4 — ABNORMAL HIGH (ref 0.8–1.2)
Prothrombin Time: 16.7 seconds — ABNORMAL HIGH (ref 11.4–15.2)

## 2021-05-03 LAB — RESP PANEL BY RT-PCR (FLU A&B, COVID) ARPGX2
Influenza A by PCR: NEGATIVE
Influenza B by PCR: NEGATIVE
SARS Coronavirus 2 by RT PCR: NEGATIVE

## 2021-05-03 LAB — TROPONIN I (HIGH SENSITIVITY)
Troponin I (High Sensitivity): 3 ng/L (ref <18)
Troponin I (High Sensitivity): 3 ng/L (ref <18)

## 2021-05-03 LAB — LACTIC ACID, PLASMA
Lactic Acid, Venous: 1.2 mmol/L (ref 0.5–1.9)
Lactic Acid, Venous: 0.8 mmol/L (ref 0.5–1.9)

## 2021-05-03 MED ORDER — IPRATROPIUM-ALBUTEROL 0.5-2.5 (3) MG/3ML IN SOLN
3.0000 mL | Freq: Once | RESPIRATORY_TRACT | Status: AC
Start: 2021-05-03 — End: 2021-05-03
  Administered 2021-05-03: 3 mL via RESPIRATORY_TRACT
  Filled 2021-05-03: qty 3

## 2021-05-03 MED ORDER — IOHEXOL 350 MG/ML SOLN
100.0000 mL | Freq: Once | INTRAVENOUS | Status: AC | PRN
  Administered 2021-05-03: 100 mL via INTRAVENOUS

## 2021-05-03 MED ORDER — HYDROMORPHONE HCL 1 MG/ML IJ SOLN
1.0000 mg | Freq: Once | INTRAMUSCULAR | Status: AC
  Administered 2021-05-03: 1 mg via INTRAVENOUS
  Filled 2021-05-03: qty 1

## 2021-05-03 NOTE — ED Notes (Signed)
He has informed us he wishes to leave; and proceeds to do so.

## 2021-05-03 NOTE — ED Triage Notes (Signed)
Pt arrives with wife.  Pt had lumbar surgery on 11/17 and 11/18.  Pt developed shortness of breath on 11/21.  Has history of PE in 2001.  Takes Xarelto but stopped 3 days prior to surgery and restarted on 11/20.

## 2021-05-03 NOTE — ED Provider Notes (Signed)
[facility] EMERGENCY DEPT Provider Note   CSN: [id number] Arrival date & time: 05/03/21  5102     History Chief Complaint  Patient presents with   Shortness of Breath    Tony Frye is a 60 y.o. male.  HPI Patient is status post a staged lumbar surgery on 11\17 and 11\18.  Dr. Lynann Bologna did L4-L5 and L5-S1 lumbar fusion.  There was abdominal approach assisted by vascular surgery.  Patient reports that he has been increasingly short of breath over the past couple of days.  Most noticeable today.  He and his wife had concern for possible pulmonary embolus.  Patient has prior history of PE postoperatively.  He held his Xarelto for 3 days before surgery.  He has resumed Xarelto and at this point has been on for 4-1/2 days.  He denies any chest pain.  Patient was having waxing waning fevers during his hospitalization.  No pneumonia or other specific etiology except postoperative fever was identified.  Patient denies he is having significant swelling or pain in his legs.  He reports he does have some abdominal pain in the site of the surgical incision.  Reports area in his back is less painful.  No new weakness numbness or tingling.  Reports he does need his pain medications and is still uncomfortable postoperatively.  Patient has been coughing.  Scant amount of productive cough.    Past Medical History:  Diagnosis Date   Cancer ([place])    squamous cell, face   Depressed    Hiatal hernia    Hypertension    Low testosterone    OSA on CPAP    Osteoarthritis    both knees   Reflux esophagitis     Patient Active Problem List   Diagnosis Date Noted   Radiculopathy 04/26/2021   Chronic back pain 04/18/2021    Past Surgical History:  Procedure Laterality Date   ABDOMINAL EXPOSURE N/A 04/26/2021   Procedure: ABDOMINAL EXPOSURE;  Surgeon: Marty Heck, MD;  Location: [facility];  Service: Vascular;  Laterality: N/A;   ANTERIOR LUMBAR FUSION N/A 04/26/2021    Procedure: ANTERIOR LUMBAR INTERBODY FUSION LUMBAR 4- LUMBAR 5, LUMBAR 5- SACRUM 1 WITH INSTRUMENTATION AND ALLOGRAFT;  Surgeon: Phylliss Bob, MD;  Location: [facility];  Service: Orthopedics;  Laterality: N/A;   BACK SURGERY     JOINT REPLACEMENT     KNEE ARTHROSCOPY Right    LUMBAR LAMINECTOMY  2022   partial ampu     REPLACEMENT TOTAL KNEE Left 2008   revision, August 2021   REPLACEMENT TOTAL KNEE Right 2010       Family History  Problem Relation Age of Onset   Cancer - Colon Mother    Cancer - Other Mother    Heart disease Father    Hypertension Maternal Grandfather     Social History   Tobacco Use   Smoking status: Former    Packs/day: 0.50    Types: Cigarettes    Quit date: 2002    Years since quitting: 20.9   Smokeless tobacco: Never  Vaping Use   Vaping Use: Never used  Substance Use Topics   Alcohol use: Never   Drug use: Never    Home Medications Prior to Admission medications   Medication Sig Start Date End Date Taking? Authorizing Provider  buPROPion (WELLBUTRIN XL) 300 MG 24 hr tablet Take 300 mg by mouth every morning. 11/28/20  Yes [provider]  ciclopirox (PENLAC) 8 % solution Apply 1 application  topically 2 (two) times a week. 03/27/21  Yes [provider]  levocetirizine (XYZAL) 5 MG tablet Take 5 mg by mouth every evening. 09/20/20  Yes [provider]  lisinopril (ZESTRIL) 10 MG tablet Take 10 mg by mouth daily.   Yes [provider]  methocarbamol (ROBAXIN) 500 MG tablet Take 1 tablet (500 mg total) by mouth every 6 (six) hours as needed for muscle spasms. 04/27/21  Yes Phylliss Bob, MD  Multiple Vitamin (MULTIVITAMIN WITH MINERALS) TABS tablet Take 1 tablet by mouth daily.   Yes [provider]  pantoprazole (PROTONIX) 40 MG tablet Take 40 mg by mouth daily.   Yes [provider]  pregabalin (LYRICA) 150 MG capsule Take 150 mg by mouth 2 (two) times daily. 10/03/20  Yes [provider]   rosuvastatin (CRESTOR) 10 MG tablet Take 10 mg by mouth daily. 03/19/19  Yes [provider]  tamsulosin (FLOMAX) 0.4 MG CAPS capsule Take 0.4 mg by mouth every evening. 03/27/21  Yes [provider]  testosterone cypionate (DEPOTESTOSTERONE CYPIONATE) 200 MG/ML injection Inject 400 mg into the muscle every 21 ( twenty-one) days.   Yes [provider]  traMADol (ULTRAM) 50 MG tablet Take 50 mg by mouth every 6 (six) hours as needed.   Yes [provider]  oxyCODONE-acetaminophen (PERCOCET/ROXICET) 5-325 MG tablet Take 1-2 tablets by mouth every 4 (four) hours as needed for severe pain. Patient not taking: Reported on 05/03/2021 04/27/21   Phylliss Bob, MD    Allergies    Penicillins, Buprenorphine hcl, Nexium [esomeprazole magnesium], Codeine, and Morphine and related  Review of Systems   Review of Systems 10 Systems reviewed and negative except as per HPI Physical Exam Updated Vital Signs BP 98/71   Pulse 62   Temp 98.1 F (36.7 C) (Oral)   Resp 15   Ht 6\' 2"  (1.88 m)   Wt 120.2 kg   SpO2 100%   BMI 34.02 kg/m   Physical Exam Constitutional:      Comments: Alert and nontoxic.  Mild tachypnea.  HENT:     Head: Normocephalic and atraumatic.     Mouth/Throat:     Pharynx: Oropharynx is clear.  Eyes:     Extraocular Movements: Extraocular movements intact.  Cardiovascular:     Rate and Rhythm: Normal rate and regular rhythm.     Comments: No rub murmur gallop Pulmonary:     Comments: Mild tachypnea.  Cough with deep inspiration.  Occasional wheeze that clear with cough and deep inspiration.  Airflow symmetric.  Slight crackle at bases Abdominal:     Comments: Surgical solution is clean dry and intact with Steri-Strips.  Some diffuse tenderness but no guarding and no appearance of significant erythema around the wound.  Musculoskeletal:     Comments: No significant peripheral edema calves are soft and pliable feet are warm and dry without  wounds.  Distal pulses 2+  Skin:    General: Skin is warm and dry.  Neurological:     General: No focal deficit present.     Mental Status: He is oriented to person, place, and time.     Motor: No weakness.     Coordination: Coordination normal.    ED Results / Procedures / Treatments   Labs (all labs ordered are listed, but only abnormal results are displayed) Labs Reviewed  COMPREHENSIVE METABOLIC PANEL - Abnormal; Notable for the following components:      Result Value   Sodium 133 (*)  Glucose, Bld 112 (*)    All other components within normal limits  CBC WITH DIFFERENTIAL/PLATELET - Abnormal; Notable for the following components:   RBC 3.88 (*)    Hemoglobin 11.2 (*)    HCT 34.0 (*)    All other components within normal limits  PROTIME-INR - Abnormal; Notable for the following components:   Prothrombin Time 16.7 (*)    INR 1.4 (*)    All other components within normal limits  I-STAT VENOUS BLOOD GAS, ED - Abnormal; Notable for the following components:   pO2, Ven 18.0 (*)    Bicarbonate 29.2 (*)    Acid-Base Excess 3.0 (*)    HCT 34.0 (*)    Hemoglobin 11.6 (*)    All other components within normal limits  RESP PANEL BY RT-PCR (FLU A&B, COVID) ARPGX2  BRAIN NATRIURETIC PEPTIDE  LACTIC ACID, PLASMA  LACTIC ACID, PLASMA  URINALYSIS, ROUTINE W REFLEX MICROSCOPIC  TROPONIN I (HIGH SENSITIVITY)  TROPONIN I (HIGH SENSITIVITY)    EKG EKG Interpretation  Date/Time:  Wednesday May 03 2021 11:13:06 EST Ventricular Rate:  71 PR Interval:  164 QRS Duration: 89 QT Interval:  383 QTC Calculation: 417 R Axis:   19 Text Interpretation: Sinus rhythm Borderline T abnormalities, inferior leads normal, no old comparison available Confirmed by Charlesetta Shanks [phone]) on 05/03/2021 11:57:14 AM  Radiology CT Angio Chest PE W/Cm &/Or Wo Cm  Result Date: 05/03/2021 CLINICAL DATA:  Short of breath rule out pulmonary embolism. Back surgery 04/27/2021. On Xarelto. EXAM: CT  ANGIOGRAPHY CHEST WITH CONTRAST TECHNIQUE: Multidetector CT imaging of the chest was performed using the standard protocol during bolus administration of intravenous contrast. Multiplanar CT image reconstructions and MIPs were obtained to evaluate the vascular anatomy. CONTRAST:  114mL OMNIPAQUE IOHEXOL 350 MG/ML SOLN COMPARISON:  Portable chest 05/03/2021 FINDINGS: Cardiovascular: Negative for pulmonary embolism. Heart size within normal limits. Mild coronary calcification. Mild aortic calcification. Mediastinum/Nodes: Small hiatal hernia. Negative for mass or adenopathy. Lungs/Pleura: Negative for infiltrate or effusion. Minimal dependent atelectasis in the lung bases. Upper Abdomen: No acute abnormality. Musculoskeletal: No acute abnormality. Review of the MIP images confirms the above findings. IMPRESSION: Negative for pulmonary embolism.  No acute abnormality in the chest. Mild atherosclerotic calcification in the coronary arteries and aortic arch. Electronically Signed   By: Franchot Gallo M.D.   On: 05/03/2021 13:17   DG Chest Port 1 View  Result Date: 05/03/2021 CLINICAL DATA:  Short of breath EXAM: PORTABLE CHEST 1 VIEW COMPARISON:  04/28/2021 FINDINGS: The heart size and mediastinal contours are within normal limits. Both lungs are clear. The visualized skeletal structures are unremarkable. IMPRESSION: No active disease. Electronically Signed   By: Franchot Gallo M.D.   On: 05/03/2021 11:11    Procedures Procedures   Medications Ordered in ED Medications  HYDROmorphone (DILAUDID) injection 1 mg (1 mg Intravenous Given 05/03/21 1220)  iohexol (OMNIPAQUE) 350 MG/ML injection 100 mL (100 mLs Intravenous Contrast Given 05/03/21 1300)  ipratropium-albuterol (DUONEB) 0.5-2.5 (3) MG/3ML nebulizer solution 3 mL (3 mLs Nebulization Given 05/03/21 1440)    ED Course  I have reviewed the triage vital signs and the nursing notes.  Pertinent labs & imaging results that were available during my care  of the patient were reviewed by me and considered in my medical decision making (see chart for details).    MDM Rules/Calculators/A&P  Patient presents postoperatively with shortness of breath.  Patient had waxing and waning fevers during hospitalization.  Sideration given to pulmonary embolus with higher risk due to prior PE.  Patient is anticoagulated for the past 4-1/2 days.  He did miss 3 days of anticoagulation in preparation for surgery.  Clinically no sign of DVT.  We will proceed with CT chest to rule out PE and evaluate for other possible pulmonary or cardiac etiology.  Patient does have some pain associated with his surgery but does not appear to have immediate surgical complications.  Will administer Dilaudid for pain control.  CT does not identify any acute findings.  No PE identified.  No pneumonia.  BNP is normal chest x-ray does not show any ischemic changes, troponin normal with no diagnostic signs of ACS as underlying etiology.  CT does not identify any pericardial effusion and exam does not suggest pericardial effusion.  Lower suspicion for pericarditis patient does not have chest pain.  At this time with negative diagnostic evaluation we will proceed with DuoNeb for suspected bronchitic process and likely some degree of atelectasis with decreased activity.  Plan was to reassess after DuoNeb for symptomatic improvement.  Patient informed nursing staff that he did not intend to wait any longer for additional evaluation or return assessment.  At this point in diagnostic evaluation, patient is not definitively cleared for discharge as I was not able to reassess prior to his leaving the emergency department.  However, diagnostic evaluation appears to have ruled out most emergent causes of patient's dyspnea including PE, pericardial effusion, ACS, hospital-acquired pneumonia.  Unfortunately I was not able to review a follow-up plan and return precautions.  Final  Clinical Impression(s) / ED Diagnoses Final diagnoses:  Shortness of breath  Status post lumbar spinal fusion    Rx / DC Orders ED Discharge Orders     None        Charlesetta Shanks, MD 05/03/21 [phone]

## 2021-05-11 DIAGNOSIS — R350 Frequency of micturition: Secondary | ICD-10-CM | POA: Diagnosis not present

## 2021-05-11 DIAGNOSIS — Z9889 Other specified postprocedural states: Secondary | ICD-10-CM | POA: Diagnosis not present

## 2021-05-11 NOTE — Discharge Summary (Signed)
Patient ID: ARSHAN JABS MRN: [id number] DOB/AGE: Oct 31, 1960 60 y.o.  Admit date: 04/26/2021 Discharge date: 04/28/2021  Admission Diagnoses:  Active Problems:   Radiculopathy   Discharge Diagnoses:  Same  Past Medical History:  Diagnosis Date   Cancer ([place])    squamous cell, face   Depressed    Hiatal hernia    Hypertension    Low testosterone    OSA on CPAP    Osteoarthritis    both knees   Reflux esophagitis     Surgeries: Procedure(s): LUMBAR 4- LUMBAR 5, LUMBAR 5- SACRUM 1 POSTERIOR SPINAL FUSION WITH INSTRUMENTATION AND ALLOGRAFT on 04/27/2021   Consultants: Treatment Team:  Marty Heck, MD  Discharged Condition: Improved  Hospital Course: LYNDEL SARATE is an 60 y.o. male who was admitted 04/26/2021 for operative treatment of radiculopathy. Patient has severe unremitting pain that affects sleep, daily activities, and work/hobbies. After pre-op clearance the patient was taken to the operating room on 04/27/2021 and underwent  Procedure(s): LUMBAR 4- LUMBAR 5, LUMBAR 5- SACRUM 1 POSTERIOR SPINAL FUSION WITH INSTRUMENTATION AND ALLOGRAFT.    Patient was given perioperative antibiotics:  Anti-infectives (From admission, onward)    Start     Dose/Rate Route Frequency Ordered Stop   04/26/21 2000  ceFAZolin (ANCEF) IVPB 2g/100 mL premix        2 g 200 mL/hr over 30 Minutes Intravenous  Once 04/26/21 1504 04/26/21 1934   04/26/21 0700  vancomycin (VANCOREADY) IVPB 1500 mg/300 mL        1,500 mg 150 mL/hr over 120 Minutes Intravenous To ShortStay Surgical 04/26/21 0647 04/26/21 0948        Patient was given sequential compression devices, early ambulation to prevent DVT.  Patient benefited maximally from hospital stay and there were no complications.    Recent vital signs: BP (!) 106/48 (BP Location: Right Arm)   Pulse 73   Temp 98.8 F (37.1 C) (Oral)   Resp 17   Ht 6\' 2"  (1.88 m)   Wt 117 kg   SpO2 95%   BMI 33.13 kg/m     Discharge Medications:   Allergies as of 04/28/2021       Reactions   Penicillins Anaphylaxis   Childhood allergy, pt states he's no longer allergic and has taken amoxicillin multiple times   Buprenorphine Hcl Rash   Nexium [esomeprazole Magnesium] Nausea And Vomiting   Codeine Hives, Rash   Pt states he's recently had this med and had no reaction   Morphine And Related Rash        Medication List     STOP taking these medications    rivaroxaban 10 MG Tabs tablet Commonly known as: XARELTO   rivaroxaban 20 MG Tabs tablet Commonly known as: XARELTO       TAKE these medications    buPROPion 300 MG 24 hr tablet Commonly known as: WELLBUTRIN XL Take 300 mg by mouth every morning.   ciclopirox 8 % solution Commonly known as: PENLAC Apply 1 application topically 2 (two) times a week.   levocetirizine 5 MG tablet Commonly known as: XYZAL Take 5 mg by mouth every evening.   lisinopril 10 MG tablet Commonly known as: ZESTRIL Take 10 mg by mouth daily.   methocarbamol 500 MG tablet Commonly known as: ROBAXIN Take 1 tablet (500 mg total) by mouth every 6 (six) hours as needed for muscle spasms.   multivitamin with minerals Tabs tablet Take 1 tablet by mouth daily.  oxyCODONE-acetaminophen 5-325 MG tablet Commonly known as: PERCOCET/ROXICET Take 1-2 tablets by mouth every 4 (four) hours as needed for severe pain.   pantoprazole 40 MG tablet Commonly known as: PROTONIX Take 40 mg by mouth daily.   pregabalin 150 MG capsule Commonly known as: LYRICA Take 150 mg by mouth 2 (two) times daily.   rosuvastatin 10 MG tablet Commonly known as: CRESTOR Take 10 mg by mouth daily.   tamsulosin 0.4 MG Caps capsule Commonly known as: FLOMAX Take 0.4 mg by mouth every evening.   testosterone cypionate 200 MG/ML injection Commonly known as: DEPOTESTOSTERONE CYPIONATE Inject 400 mg into the muscle every 21 ( twenty-one) days.        Diagnostic Studies: DG  Lumbar Spine 2-3 Views  Result Date: 04/27/2021 CLINICAL DATA:  Lumbar spine fusion L4-5 and L5-S1 with instrumentation EXAM: LUMBAR SPINE - 2-3 VIEW COMPARISON:  Lumbar MRI 03/25/2018.  C-arm images 1116 2022 FINDINGS: AP and lateral views lumbar spine demonstrate pedicle screw and interbody fusion L4-5 and L5-S1. Hardware in satisfactory position. No acute complication. IMPRESSION: Pedicle screw and interbody fusion L4-5 and L5-S1. Electronically Signed   By: Franchot Gallo M.D.   On: 04/27/2021 10:51   DG Lumbar Spine 2-3 Views  Result Date: 04/26/2021 CLINICAL DATA:  L4-S1 fusion EXAM: LUMBAR SPINE - 2-3 VIEW COMPARISON:  None. FINDINGS: Three intraoperative images. First image demonstrates localizer ventral to the L4-L5 level. Second image demonstrates interbody fusion devices at L4-L5 and L5-S1. Final image shows same in frontal view. IMPRESSION: Fluoroscopic guidance for L4-S1 interbody fusion. Electronically Signed   By: Macy Mis M.D.   On: 04/26/2021 14:18   CT Angio Chest PE W/Cm &/Or Wo Cm  Result Date: 05/03/2021 CLINICAL DATA:  Short of breath rule out pulmonary embolism. Back surgery 04/27/2021. On Xarelto. EXAM: CT ANGIOGRAPHY CHEST WITH CONTRAST TECHNIQUE: Multidetector CT imaging of the chest was performed using the standard protocol during bolus administration of intravenous contrast. Multiplanar CT image reconstructions and MIPs were obtained to evaluate the vascular anatomy. CONTRAST:  182mL OMNIPAQUE IOHEXOL 350 MG/ML SOLN COMPARISON:  Portable chest 05/03/2021 FINDINGS: Cardiovascular: Negative for pulmonary embolism. Heart size within normal limits. Mild coronary calcification. Mild aortic calcification. Mediastinum/Nodes: Small hiatal hernia. Negative for mass or adenopathy. Lungs/Pleura: Negative for infiltrate or effusion. Minimal dependent atelectasis in the lung bases. Upper Abdomen: No acute abnormality. Musculoskeletal: No acute abnormality. Review of the MIP images  confirms the above findings. IMPRESSION: Negative for pulmonary embolism.  No acute abnormality in the chest. Mild atherosclerotic calcification in the coronary arteries and aortic arch. Electronically Signed   By: Franchot Gallo M.D.   On: 05/03/2021 13:17   DG Chest Port 1 View  Result Date: 05/03/2021 CLINICAL DATA:  Short of breath EXAM: PORTABLE CHEST 1 VIEW COMPARISON:  04/28/2021 FINDINGS: The heart size and mediastinal contours are within normal limits. Both lungs are clear. The visualized skeletal structures are unremarkable. IMPRESSION: No active disease. Electronically Signed   By: Franchot Gallo M.D.   On: 05/03/2021 11:11   DG CHEST PORT 1 VIEW  Result Date: 04/28/2021 CLINICAL DATA:  Fever. EXAM: PORTABLE CHEST 1 VIEW COMPARISON:  None. FINDINGS: Shallow inspiration with minimal bibasilar atelectasis. No focal consolidation, pleural effusion, pneumothorax. The cardiac silhouette is within normal limits. No acute osseous pathology. IMPRESSION: No active disease. Electronically Signed   By: Anner Crete M.D.   On: 04/28/2021 00:41   DG C-Arm 1-60 Min-No Report  Result Date: 04/27/2021 Fluoroscopy was utilized by  the requesting physician.  No radiographic interpretation.   DG C-Arm 1-60 Min-No Report  Result Date: 04/27/2021 Fluoroscopy was utilized by the requesting physician.  No radiographic interpretation.   DG C-Arm 1-60 Min-No Report  Result Date: 04/26/2021 Fluoroscopy was utilized by the requesting physician.  No radiographic interpretation.   DG C-Arm 1-60 Min-No Report  Result Date: 04/26/2021 Fluoroscopy was utilized by the requesting physician.  No radiographic interpretation.   DG C-Arm 1-60 Min-No Report  Result Date: 04/26/2021 Fluoroscopy was utilized by the requesting physician.  No radiographic interpretation.   DG C-Arm 1-60 Min-No Report  Result Date: 04/26/2021 Fluoroscopy was utilized by the requesting physician.  No radiographic  interpretation.   DG OR LOCAL ABDOMEN  Result Date: 04/26/2021 CLINICAL DATA:  L4-S1 ALIF. Evaluate for unintentional retained foreign body. EXAM: OR LOCAL ABDOMEN COMPARISON:  Intraoperative images same date, lumbar MRI 11/22/2014 and abdominopelvic CT 02/02/2013. FINDINGS: Single AP view of the lower lumbar spine obtained portably at 1235 hours. There are postsurgical changes related to L4-5 and L5-S1 ALIF. Small surgical clips are noted overlying the upper sacrum and left aspect of the L5 vertebral body. No evidence of retained surgical instrument or other unintentional foreign body. Multilevel paraspinal osteophytes are noted. IMPRESSION: No evidence of unintentional retained foreign body post L4-S1 ALIF. These results were called by telephone at the time of interpretation on 04/26/2021 at 12:50 pm to representative for provider MARK DUMONSKI in the operating room, who verbally acknowledged these results. Electronically Signed   By: Richardean Sale M.D.   On: 04/26/2021 12:54    Disposition: Discharge disposition: 01-Home or Self Care        Pt s/p A/P L4-S1 fusion   - fever likely atelectasis and is resolving - up with PT/OT, encourage ambulation - Percocet for pain, Robaxin for muscle spasms -Scripts for pain sent to pharmacy electronically  -D/C instructions sheet printed and in chart -D/C today  -F/U in office 2 weeks   Signed: Justice Britain 05/11/2021, 12:57 PM

## 2021-05-16 DIAGNOSIS — M5116 Intervertebral disc disorders with radiculopathy, lumbar region: Secondary | ICD-10-CM | POA: Diagnosis not present

## 2021-06-06 DIAGNOSIS — Z9889 Other specified postprocedural states: Secondary | ICD-10-CM | POA: Diagnosis not present

## 2021-06-06 DIAGNOSIS — M5416 Radiculopathy, lumbar region: Secondary | ICD-10-CM | POA: Diagnosis not present

## 2021-06-19 ENCOUNTER — Encounter: Payer: Self-pay | Admitting: Hematology & Oncology

## 2021-06-19 ENCOUNTER — Inpatient Hospital Stay (HOSPITAL_BASED_OUTPATIENT_CLINIC_OR_DEPARTMENT_OTHER): Payer: BLUE CROSS/BLUE SHIELD | Admitting: Hematology & Oncology

## 2021-06-19 ENCOUNTER — Ambulatory Visit (HOSPITAL_BASED_OUTPATIENT_CLINIC_OR_DEPARTMENT_OTHER)
Admission: RE | Admit: 2021-06-19 | Discharge: 2021-06-19 | Disposition: A | Discharge status: Home / Self Care | Payer: BLUE CROSS/BLUE SHIELD | Source: Ambulatory Visit | Attending: Hematology & Oncology | Admitting: Hematology & Oncology

## 2021-06-19 ENCOUNTER — Inpatient Hospital Stay: Payer: BLUE CROSS/BLUE SHIELD | Attending: Hematology & Oncology

## 2021-06-19 ENCOUNTER — Other Ambulatory Visit: Payer: Self-pay

## 2021-06-19 ENCOUNTER — Telehealth: Payer: Self-pay | Admitting: *Deleted

## 2021-06-19 VITALS — BP 122/81 | HR 61 | Temp 98.0°F | Resp 18 | Wt 265.0 lb

## 2021-06-19 DIAGNOSIS — I2699 Other pulmonary embolism without acute cor pulmonale: Secondary | ICD-10-CM | POA: Diagnosis not present

## 2021-06-19 DIAGNOSIS — I824Y9 Acute embolism and thrombosis of unspecified deep veins of unspecified proximal lower extremity: Secondary | ICD-10-CM

## 2021-06-19 DIAGNOSIS — D6859 Other primary thrombophilia: Secondary | ICD-10-CM

## 2021-06-19 HISTORY — DX: Other pulmonary embolism without acute cor pulmonale: I26.99

## 2021-06-19 LAB — CBC WITH DIFFERENTIAL (CANCER CENTER ONLY)
Abs Immature Granulocytes: 0.02 10*3/uL (ref 0.00–0.07)
Basophils Absolute: 0.1 10*3/uL (ref 0.0–0.1)
Basophils Relative: 1 %
Eosinophils Absolute: 0.2 10*3/uL (ref 0.0–0.5)
Eosinophils Relative: 3 %
HCT: 37.9 % — ABNORMAL LOW (ref 39.0–52.0)
Hemoglobin: 12.3 g/dL — ABNORMAL LOW (ref 13.0–17.0)
Immature Granulocytes: 0 %
Lymphocytes Relative: 23 %
Lymphs Abs: 1.2 10*3/uL (ref 0.7–4.0)
MCH: 28.3 pg (ref 26.0–34.0)
MCHC: 32.5 g/dL (ref 30.0–36.0)
MCV: 87.1 fL (ref 80.0–100.0)
Monocytes Absolute: 0.4 10*3/uL (ref 0.1–1.0)
Monocytes Relative: 8 %
Neutro Abs: 3.4 10*3/uL (ref 1.7–7.7)
Neutrophils Relative %: 65 %
Platelet Count: 260 10*3/uL (ref 150–400)
RBC: 4.35 MIL/uL (ref 4.22–5.81)
RDW: 15.3 % (ref 11.5–15.5)
WBC Count: 5.2 10*3/uL (ref 4.0–10.5)
nRBC: 0 % (ref 0.0–0.2)

## 2021-06-19 LAB — CMP (CANCER CENTER ONLY)
ALT: 18 U/L (ref 0–44)
AST: 19 U/L (ref 15–41)
Albumin: 4.2 g/dL (ref 3.5–5.0)
Alkaline Phosphatase: 125 U/L (ref 38–126)
Anion gap: 8 (ref 5–15)
BUN: 16 mg/dL (ref 8–23)
CO2: 27 mmol/L (ref 22–32)
Calcium: 10 mg/dL (ref 8.9–10.3)
Chloride: 105 mmol/L (ref 98–111)
Creatinine: 1.06 mg/dL (ref 0.61–1.24)
GFR, Estimated: >60 mL/min (ref >60)
Glucose, Bld: 112 mg/dL — ABNORMAL HIGH (ref 70–99)
Potassium: 4.1 mmol/L (ref 3.5–5.1)
Sodium: 140 mmol/L (ref 135–145)
Total Bilirubin: 0.6 mg/dL (ref 0.3–1.2)
Total Protein: 6.9 g/dL (ref 6.5–8.1)

## 2021-06-19 LAB — D-DIMER, QUANTITATIVE: D-Dimer, Quant: 3.92 ug/mL-FEU — ABNORMAL HIGH (ref 0.00–0.50)

## 2021-06-19 LAB — LACTATE DEHYDROGENASE: LDH: 169 U/L (ref 98–192)

## 2021-06-19 NOTE — Telephone Encounter (Signed)
-----   Message from Volanda Napoleon, MD sent at 06/19/2021 10:29 AM EST ----- Call - there is NO blood colt in the LEFT leg.  He has a large Baker's cyst -- he needs to go to his Orthopedist to see if this needs to be drained!!  Laurey Arrow

## 2021-06-19 NOTE — Progress Notes (Signed)
Hematology and Oncology Follow Up Visit  Tony Frye [id number] 27-Sep-1960 61 y.o. 06/19/2021   Principle Diagnosis:  History of pulmonary embolism-post left knee surgery-01/2020  Current Therapy:   Xarelto 10 mg p.o. daily     Interim History:  Tony Frye is back for follow-up.  He was first seen back in July 2022.  He has a interesting history.  He had a blood clot in the lung following knee replacement surgery.  Recently, he underwent back fusion.  This was in the lumbar spine.  He had an initial surgery back in April which did not go all that well.  He got through his back surgery in November without difficulties.  His initial hypercoagulable studies all were negative.  Of course, there was a lupus anticoagulant.  I am not sure exactly what this means given that the anticardiolipin antibodies and beta-2 glycoprotein's were all normal.  He is on Xarelto 10 mg p.o. daily now.  Somehow, this is not on his medication list.  He has a persistently elevated D-dimer.  I would think this might be elevated given that he had the recent back surgery.  He is complaining of pain and swelling in the left leg.  We did do a Doppler today.  This showed a Bakers cyst.  There is no thromboembolic disease.  We told him to see his orthopedic surgeon for this.  He has had no bleeding.  There is no change in bowel or bladder habits.  There is been no problems with fever.  He has had no issues with COVID.  He stopped smoking back in 2002.  He does not drink alcohol.  Overall, I would say his performance status is probably ECOG 1.   Medications:  Current Outpatient Medications:    buPROPion (WELLBUTRIN XL) 300 MG 24 hr tablet, Take 300 mg by mouth every morning., Disp: , Rfl:    ciclopirox (PENLAC) 8 % solution, Apply 1 application topically 2 (two) times a week., Disp: , Rfl:    levocetirizine (XYZAL) 5 MG tablet, Take 5 mg by mouth every evening., Disp: , Rfl:    lisinopril (ZESTRIL) 10 MG  tablet, Take 10 mg by mouth daily., Disp: , Rfl:    methocarbamol (ROBAXIN) 500 MG tablet, Take 1 tablet (500 mg total) by mouth every 6 (six) hours as needed for muscle spasms., Disp: 30 tablet, Rfl: 1   Multiple Vitamin (MULTIVITAMIN WITH MINERALS) TABS tablet, Take 1 tablet by mouth daily., Disp: , Rfl:    oxyCODONE-acetaminophen (PERCOCET/ROXICET) 5-325 MG tablet, Take 1-2 tablets by mouth every 4 (four) hours as needed for severe pain. (Patient not taking: Reported on 05/03/2021), Disp: 30 tablet, Rfl: 0   pantoprazole (PROTONIX) 40 MG tablet, Take 40 mg by mouth daily., Disp: , Rfl:    pregabalin (LYRICA) 150 MG capsule, Take 150 mg by mouth 2 (two) times daily., Disp: , Rfl:    rosuvastatin (CRESTOR) 10 MG tablet, Take 10 mg by mouth daily., Disp: , Rfl:    tamsulosin (FLOMAX) 0.4 MG CAPS capsule, Take 0.4 mg by mouth every evening., Disp: , Rfl:    testosterone cypionate (DEPOTESTOSTERONE CYPIONATE) 200 MG/ML injection, Inject 400 mg into the muscle every 21 ( twenty-one) days., Disp: , Rfl:    traMADol (ULTRAM) 50 MG tablet, Take 50 mg by mouth every 6 (six) hours as needed., Disp: , Rfl:   Allergies:  Allergies  Allergen Reactions   Penicillins Anaphylaxis    Childhood allergy, pt states he's no longer  allergic and has taken amoxicillin multiple times   Buprenorphine Hcl Rash   Esomeprazole Magnesium Nausea And Vomiting and Other (See Comments)   Morphine Other (See Comments)   Codeine Hives, Rash and Other (See Comments)    Pt states he's recently had this med and had no reaction   Morphine And Related Rash    Past Medical History, Surgical history, Social history, and Family History were reviewed and updated.  Review of Systems: Review of Systems  Constitutional: Negative.   HENT:  Negative.    Eyes: Negative.   Respiratory: Negative.    Cardiovascular:  Positive for leg swelling.  Gastrointestinal: Negative.   Endocrine: Negative.   Genitourinary: Negative.     Musculoskeletal:  Positive for back pain.  Skin: Negative.   Neurological: Negative.   Hematological: Negative.   Psychiatric/Behavioral: Negative.     Physical Exam:  weight is 265 lb (120.2 kg). His oral temperature is 98 F (36.7 C). His blood pressure is 122/81 and his pulse is 61. His respiration is 18 and oxygen saturation is 100%.   Wt Readings from Last 3 Encounters:  06/19/21 265 lb (120.2 kg)  05/03/21 265 lb (120.2 kg)  04/26/21 258 lb (117 kg)    Physical Exam Vitals reviewed.  HENT:     Head: Normocephalic and atraumatic.  Eyes:     Pupils: Pupils are equal, round, and reactive to light.  Cardiovascular:     Rate and Rhythm: Normal rate and regular rhythm.     Heart sounds: Normal heart sounds.  Pulmonary:     Effort: Pulmonary effort is normal.     Breath sounds: Normal breath sounds.  Abdominal:     General: Bowel sounds are normal.     Palpations: Abdomen is soft.  Musculoskeletal:        General: No tenderness or deformity. Normal range of motion.     Cervical back: Normal range of motion.     Comments: Back exam shows the laminectomy scars in the lumbar spine.  These are healing.  He is wearing a back brace.  He has some swelling in the left lower leg.  This is more prominent in the upper portion of the left lower leg.  I cannot palpate any obvious venous cord in the leg.  He has good pulses bilaterally.  Lymphadenopathy:     Cervical: No cervical adenopathy.  Skin:    General: Skin is warm and dry.     Findings: No erythema or rash.  Neurological:     Mental Status: He is alert and oriented to person, place, and time.  Psychiatric:        Behavior: Behavior normal.        Thought Content: Thought content normal.        Judgment: Judgment normal.     Lab Results  Component Value Date   WBC 5.2 06/19/2021   HGB 12.3 (L) 06/19/2021   HCT 37.9 (L) 06/19/2021   MCV 87.1 06/19/2021   PLT 260 06/19/2021     Chemistry      Component Value  Date/Time   NA 140 06/19/2021 0755   K 4.1 06/19/2021 0755   CL 105 06/19/2021 0755   CO2 27 06/19/2021 0755   BUN 16 06/19/2021 0755   CREATININE 1.06 06/19/2021 0755      Component Value Date/Time   CALCIUM 10.0 06/19/2021 0755   ALKPHOS 125 06/19/2021 0755   AST 19 06/19/2021 0755   ALT 18 06/19/2021 0755  BILITOT 0.6 06/19/2021 0755      Impression and Plan: Mr. Scallon is a very nice 61 year old white male.  I just feel bad that he has had all the back issues.  He just had major back surgery just 2 months ago.  He had spinal fusion.  Thankfully there is no blood clot in the left leg.  This was a Baker's cyst.  He can see his Orthopedic Surgeon for this.  He is on Xarelto right now.  I probably keep him on his Xarelto for quite a while.  Again we will have to check the lupus anticoagulant.  This seems to be positive quite often now.  I would like to see him back in another 6 weeks or so.  Again I am happy that there is no blood clot in the left leg.    Volanda Napoleon, MD 1/9/20235:06 PM

## 2021-06-19 NOTE — Telephone Encounter (Signed)
As noted below by Dr. Marin Olp, I informed the patient that there was NO blood clot in the left leg. However, you have a Baker's cyst. You need to follow up with your Orthopedist to see if it needs to be drained. He verbalized understanding.

## 2021-06-20 LAB — LUPUS ANTICOAGULANT PANEL
DRVVT: 67.8 s — ABNORMAL HIGH (ref 0.0–47.0)
PTT Lupus Anticoagulant: 38 s (ref 0.0–51.9)

## 2021-06-20 LAB — DRVVT CONFIRM: dRVVT Confirm: 1.3 ratio — ABNORMAL HIGH (ref 0.8–1.2)

## 2021-06-20 LAB — DRVVT MIX: dRVVT Mix: 57.6 s — ABNORMAL HIGH (ref 0.0–40.4)

## 2021-07-14 ENCOUNTER — Other Ambulatory Visit: Payer: Self-pay | Admitting: Family

## 2021-07-14 ENCOUNTER — Telehealth: Payer: Self-pay | Admitting: *Deleted

## 2021-07-14 NOTE — Telephone Encounter (Signed)
Call received from patient stating that the cost of Xarelto is too high for him at this time d/t he is not currently working and would like to know if there are any alternatives.  Pt given phone numbers to [employer] and [employer] to check on financial assistance for Xarelto.  Pt then instructed to contact his insurance company and to check on cost of Pradaxa and Eliquis as alternatives.  Pt appreciative of help and will call office back once he has spoken with his insurance company.

## 2021-07-17 ENCOUNTER — Other Ambulatory Visit: Payer: Self-pay

## 2021-07-18 ENCOUNTER — Telehealth: Payer: Self-pay

## 2021-07-18 ENCOUNTER — Other Ambulatory Visit: Payer: Self-pay

## 2021-07-18 DIAGNOSIS — I2699 Other pulmonary embolism without acute cor pulmonale: Secondary | ICD-10-CM

## 2021-07-18 MED ORDER — DABIGATRAN ETEXILATE MESYLATE 75 MG PO CAPS: 75.0000 mg | ORAL_CAPSULE | Freq: Two times a day (BID) | ORAL | 1 refills | Status: DC

## 2021-07-18 NOTE — Telephone Encounter (Signed)
Patient called stating he called his insurance and got prices on all the different medications as discussed, he is requesting for an rx for generic pradaxa. Discussed with MD, verbal order for dabigatran 75mg  BID, pt informed and requested [facility] in [place]. Rx sent

## 2021-07-20 ENCOUNTER — Other Ambulatory Visit: Payer: Self-pay

## 2021-07-20 DIAGNOSIS — I2699 Other pulmonary embolism without acute cor pulmonale: Secondary | ICD-10-CM

## 2021-07-20 MED ORDER — DABIGATRAN ETEXILATE MESYLATE 75 MG PO CAPS: 75.0000 mg | ORAL_CAPSULE | Freq: Two times a day (BID) | ORAL | 1 refills | Status: DC

## 2021-07-21 ENCOUNTER — Telehealth: Payer: Self-pay | Admitting: *Deleted

## 2021-07-21 MED ORDER — RIVAROXABAN 10 MG PO TABS: 10.0000 mg | ORAL_TABLET | Freq: Every day | ORAL | 0 refills | Status: DC

## 2021-07-21 NOTE — Telephone Encounter (Signed)
Message received from patient stating that he has received financial assistance through [facility] and would like to stay with taking Xarelto 10 mg daily.  Pradaxa d/c'd from patient's medication list and Xarelto 10 mg PO daily added.

## 2021-08-01 ENCOUNTER — Inpatient Hospital Stay: Payer: BLUE CROSS/BLUE SHIELD | Attending: Hematology & Oncology

## 2021-08-01 ENCOUNTER — Other Ambulatory Visit: Payer: Self-pay

## 2021-08-01 ENCOUNTER — Inpatient Hospital Stay (HOSPITAL_BASED_OUTPATIENT_CLINIC_OR_DEPARTMENT_OTHER): Payer: BLUE CROSS/BLUE SHIELD | Admitting: Hematology & Oncology

## 2021-08-01 ENCOUNTER — Encounter: Payer: Self-pay | Admitting: Hematology & Oncology

## 2021-08-01 VITALS — BP 126/60 | HR 49 | Temp 98.2°F | Resp 18 | Wt 266.4 lb

## 2021-08-01 DIAGNOSIS — Z7901 Long term (current) use of anticoagulants: Secondary | ICD-10-CM | POA: Insufficient documentation

## 2021-08-01 DIAGNOSIS — Z86711 Personal history of pulmonary embolism: Secondary | ICD-10-CM | POA: Insufficient documentation

## 2021-08-01 DIAGNOSIS — Z79899 Other long term (current) drug therapy: Secondary | ICD-10-CM | POA: Diagnosis not present

## 2021-08-01 DIAGNOSIS — I2690 Septic pulmonary embolism without acute cor pulmonale: Secondary | ICD-10-CM

## 2021-08-01 DIAGNOSIS — I2699 Other pulmonary embolism without acute cor pulmonale: Secondary | ICD-10-CM

## 2021-08-01 DIAGNOSIS — D6862 Lupus anticoagulant syndrome: Secondary | ICD-10-CM | POA: Diagnosis present

## 2021-08-01 LAB — CBC WITH DIFFERENTIAL (CANCER CENTER ONLY)
Abs Immature Granulocytes: 0.01 10*3/uL (ref 0.00–0.07)
Basophils Absolute: 0.1 10*3/uL (ref 0.0–0.1)
Basophils Relative: 1 %
Eosinophils Absolute: 0.2 10*3/uL (ref 0.0–0.5)
Eosinophils Relative: 3 %
HCT: 39 % (ref 39.0–52.0)
Hemoglobin: 12.7 g/dL — ABNORMAL LOW (ref 13.0–17.0)
Immature Granulocytes: 0 %
Lymphocytes Relative: 25 %
Lymphs Abs: 1.2 10*3/uL (ref 0.7–4.0)
MCH: 28.8 pg (ref 26.0–34.0)
MCHC: 32.6 g/dL (ref 30.0–36.0)
MCV: 88.4 fL (ref 80.0–100.0)
Monocytes Absolute: 0.4 10*3/uL (ref 0.1–1.0)
Monocytes Relative: 8 %
Neutro Abs: 3 10*3/uL (ref 1.7–7.7)
Neutrophils Relative %: 63 %
Platelet Count: 229 10*3/uL (ref 150–400)
RBC: 4.41 MIL/uL (ref 4.22–5.81)
RDW: 15.9 % — ABNORMAL HIGH (ref 11.5–15.5)
WBC Count: 4.8 10*3/uL (ref 4.0–10.5)
nRBC: 0 % (ref 0.0–0.2)

## 2021-08-01 LAB — CMP (CANCER CENTER ONLY)
ALT: 15 U/L (ref 0–44)
AST: 19 U/L (ref 15–41)
Albumin: 4.1 g/dL (ref 3.5–5.0)
Alkaline Phosphatase: 98 U/L (ref 38–126)
Anion gap: 7 (ref 5–15)
BUN: 18 mg/dL (ref 8–23)
CO2: 28 mmol/L (ref 22–32)
Calcium: 9.1 mg/dL (ref 8.9–10.3)
Chloride: 103 mmol/L (ref 98–111)
Creatinine: 1.14 mg/dL (ref 0.61–1.24)
GFR, Estimated: >60 mL/min (ref >60)
Glucose, Bld: 98 mg/dL (ref 70–99)
Potassium: 3.8 mmol/L (ref 3.5–5.1)
Sodium: 138 mmol/L (ref 135–145)
Total Bilirubin: 1.1 mg/dL (ref 0.3–1.2)
Total Protein: 7 g/dL (ref 6.5–8.1)

## 2021-08-01 LAB — D-DIMER, QUANTITATIVE: D-Dimer, Quant: 2.32 ug/mL-FEU — ABNORMAL HIGH (ref 0.00–0.50)

## 2021-08-01 MED ORDER — DICLOFENAC SODIUM 75 MG PO TBEC: 75.0000 mg | DELAYED_RELEASE_TABLET | Freq: Three times a day (TID) | ORAL | 2 refills | Status: DC | PRN

## 2021-08-01 NOTE — Addendum Note (Signed)
Addended by: Volanda Napoleon on: 08/01/2021 09:24 AM   Modules accepted: Orders

## 2021-08-01 NOTE — Progress Notes (Addendum)
Hematology and Oncology Follow Up Visit  Tony Frye [id number] 12-21-1960 61 y.o. 08/01/2021   Principle Diagnosis:  History of pulmonary embolism-post left knee surgery-01/2020 --positive lupus anticoagulant  Current Therapy:   Xarelto 10 mg p.o. daily     Interim History:  Tony Frye is back for follow-up.  We last saw him back in January.  At that time, we did do a Doppler of his leg which did not show any evidence of a thromboembolic event.  Swelling in the left knee.  He does have a Bakers cyst but his family doctor does not wish to drain this.  We did see him back in January, he did have a lupus anticoagulant test.  This could only be transient.  We will have to monitor and do serial studies on this.  He is on Xarelto at 10 mg a day.  He is doing well with this.  His last CT angiogram that was I think back in November 2022 did not show any evidence of thromboembolic disease.  His real problem is his arthritis.  His horrible arthritis.  He is on Voltaren.  I told him he can take the Voltaren as long as he takes it with food.  He has had no change in bowel or bladder habits.  He has had no nausea or vomiting.  He has had no obvious bleeding.  There is no chest wall pain.  He has had no cough.  Overall, his performance status is ECOG 1.     Medications:  Current Outpatient Medications:    buPROPion (WELLBUTRIN XL) 300 MG 24 hr tablet, Take 300 mg by mouth every morning., Disp: , Rfl:    diclofenac (VOLTAREN) 75 MG EC tablet, Take 75 mg by mouth every 3 (three) days., Disp: , Rfl:    levocetirizine (XYZAL) 5 MG tablet, Take 5 mg by mouth every evening., Disp: , Rfl:    lisinopril (ZESTRIL) 10 MG tablet, Take 10 mg by mouth daily., Disp: , Rfl:    methocarbamol (ROBAXIN) 500 MG tablet, Take 1 tablet (500 mg total) by mouth every 6 (six) hours as needed for muscle spasms., Disp: 30 tablet, Rfl: 1   Multiple Vitamin (MULTIVITAMIN WITH MINERALS) TABS tablet, Take 1 tablet by  mouth daily., Disp: , Rfl:    pantoprazole (PROTONIX) 40 MG tablet, Take 40 mg by mouth daily., Disp: , Rfl:    rivaroxaban (XARELTO) 10 MG TABS tablet, Take 1 tablet (10 mg total) by mouth daily., Disp: 30 tablet, Rfl: 0   rosuvastatin (CRESTOR) 10 MG tablet, Take 10 mg by mouth daily., Disp: , Rfl:    tamsulosin (FLOMAX) 0.4 MG CAPS capsule, Take 0.4 mg by mouth every evening., Disp: , Rfl:    traMADol (ULTRAM) 50 MG tablet, Take 50 mg by mouth every 6 (six) hours as needed., Disp: , Rfl:    ciclopirox (PENLAC) 8 % solution, Apply 1 application topically 2 (two) times a week. (Patient not taking: Reported on 08/01/2021), Disp: , Rfl:    oxyCODONE-acetaminophen (PERCOCET/ROXICET) 5-325 MG tablet, Take 1-2 tablets by mouth every 4 (four) hours as needed for severe pain. (Patient not taking: Reported on 05/03/2021), Disp: 30 tablet, Rfl: 0   pregabalin (LYRICA) 150 MG capsule, Take 150 mg by mouth 2 (two) times daily. (Patient not taking: Reported on 08/01/2021), Disp: , Rfl:   Allergies:  Allergies  Allergen Reactions   Penicillins Anaphylaxis    Childhood allergy, pt states he's no longer allergic and has taken amoxicillin  multiple times   Buprenorphine Hcl Rash   Esomeprazole Magnesium Nausea And Vomiting and Other (See Comments)   Morphine Other (See Comments)   Codeine Hives, Rash and Other (See Comments)    Pt states he's recently had this med and had no reaction   Morphine And Related Rash    Past Medical History, Surgical history, Social history, and Family History were reviewed and updated.  Review of Systems: Review of Systems  Constitutional: Negative.   HENT:  Negative.    Eyes: Negative.   Respiratory: Negative.    Cardiovascular:  Positive for leg swelling.  Gastrointestinal: Negative.   Endocrine: Negative.   Genitourinary: Negative.    Musculoskeletal:  Positive for back pain.  Skin: Negative.   Neurological: Negative.   Hematological: Negative.    Psychiatric/Behavioral: Negative.     Physical Exam:  weight is 266 lb 6.4 oz (120.8 kg). His oral temperature is 98.2 F (36.8 C). His blood pressure is 126/60 and his pulse is 49 (abnormal). His respiration is 18 and oxygen saturation is 100%.   Wt Readings from Last 3 Encounters:  08/01/21 266 lb 6.4 oz (120.8 kg)  06/19/21 265 lb (120.2 kg)  05/03/21 265 lb (120.2 kg)    Physical Exam Vitals reviewed.  HENT:     Head: Normocephalic and atraumatic.  Eyes:     Pupils: Pupils are equal, round, and reactive to light.  Cardiovascular:     Rate and Rhythm: Normal rate and regular rhythm.     Heart sounds: Normal heart sounds.  Pulmonary:     Effort: Pulmonary effort is normal.     Breath sounds: Normal breath sounds.  Abdominal:     General: Bowel sounds are normal.     Palpations: Abdomen is soft.  Musculoskeletal:        General: No tenderness or deformity. Normal range of motion.     Cervical back: Normal range of motion.     Comments: Back exam shows the laminectomy scars in the lumbar spine.  These are healing.  He is wearing a back brace.  He has some swelling in the left lower leg.  This is more prominent in the upper portion of the left lower leg.  I cannot palpate any obvious venous cord in the leg.  He has good pulses bilaterally.  Lymphadenopathy:     Cervical: No cervical adenopathy.  Skin:    General: Skin is warm and dry.     Findings: No erythema or rash.  Neurological:     Mental Status: He is alert and oriented to person, place, and time.  Psychiatric:        Behavior: Behavior normal.        Thought Content: Thought content normal.        Judgment: Judgment normal.     Lab Results  Component Value Date   WBC 4.8 08/01/2021   HGB 12.7 (L) 08/01/2021   HCT 39.0 08/01/2021   MCV 88.4 08/01/2021   PLT 229 08/01/2021     Chemistry      Component Value Date/Time   NA 140 06/19/2021 0755   K 4.1 06/19/2021 0755   CL 105 06/19/2021 0755   CO2 27  06/19/2021 0755   BUN 16 06/19/2021 0755   CREATININE 1.06 06/19/2021 0755      Component Value Date/Time   CALCIUM 10.0 06/19/2021 0755   ALKPHOS 125 06/19/2021 0755   AST 19 06/19/2021 0755   ALT 18 06/19/2021 0755  BILITOT 0.6 06/19/2021 0755      Impression and Plan: Tony Frye is a very nice 61 year old white male.  I just feel bad that he has had all the back issues.  He just had major back surgery just 2 months ago.  He had spinal fusion.  It will be interesting to see if he does have a persistently elevated lupus anticoagulant.  He says he does take testosterone supplementation.  I think that with the testosterone supplementation and the lupus anticoagulant, we are going to have to have him on lifelong anticoagulation.  I will plan to get her back to see me in 4 months.  Clearly, his quality of life is much better when he does take testosterone.   Volanda Napoleon, MD 2/21/20238:08 AM   ADDENDUM:  (+) Lupus anticoagulant

## 2021-08-02 LAB — TESTOSTERONE: Testosterone: 1030 ng/dL — ABNORMAL HIGH (ref 264–916)

## 2021-08-03 LAB — HEXAGONAL PHASE PHOSPHOLIPID: Hexagonal Phase Phospholipid: 7 s (ref 0–11)

## 2021-08-03 LAB — LUPUS ANTICOAGULANT PANEL
DRVVT: 136.1 s — ABNORMAL HIGH (ref 0.0–47.0)
PTT Lupus Anticoagulant: 49.2 s — ABNORMAL HIGH (ref 0.0–43.5)

## 2021-08-03 LAB — DRVVT CONFIRM: dRVVT Confirm: 2.1 ratio — ABNORMAL HIGH (ref 0.8–1.2)

## 2021-08-03 LAB — PTT-LA MIX: PTT-LA Mix: 46 s — ABNORMAL HIGH (ref 0.0–40.5)

## 2021-08-03 LAB — DRVVT MIX: dRVVT Mix: 82.6 s — ABNORMAL HIGH (ref 0.0–40.4)

## 2021-08-04 ENCOUNTER — Telehealth: Payer: Self-pay

## 2021-08-04 NOTE — Telephone Encounter (Signed)
Called and informed patient of lab results, patient verbalized understanding and would like to know if there is anything he needs to be ok the lookout for with s/s. Per Dr Marin Olp no there is nothing to worry about just know he needs to take his blood thinner indefinitely. Pt advised and states understanding.

## 2021-08-04 NOTE — Telephone Encounter (Signed)
-----   Message from Volanda Napoleon, MD sent at 08/03/2021  4:57 PM EST ----- Please call and let him know that he does have a lupus anticoagulant that is likely the source of the thrombus.  As such, he is going need to be on lifelong anticoagulation.  Laurey Arrow

## 2021-09-05 ENCOUNTER — Telehealth: Payer: Self-pay | Admitting: *Deleted

## 2021-09-05 NOTE — Telephone Encounter (Signed)
Returned patient's phone call regarding dental work. Instructed him to call the dental office and have them fax over the dental clearance form to our office. Fax number [phone]. ?

## 2021-11-28 ENCOUNTER — Telehealth: Payer: Self-pay | Admitting: *Deleted

## 2021-11-28 NOTE — Telephone Encounter (Signed)
Patient called stating that his insurance has changed and our office is no longer in network.  Asking if he can wait till January when his insurance changes to be seen in our office.  Talked with Dr Marin Olp who said it is fine if he wants to follow up with his primary care office until January. Patient appreciates call and will be back in touch with [place].

## 2021-11-29 ENCOUNTER — Inpatient Hospital Stay: Payer: BLUE CROSS/BLUE SHIELD

## 2021-11-29 ENCOUNTER — Ambulatory Visit: Payer: BLUE CROSS/BLUE SHIELD | Admitting: Hematology & Oncology

## 2021-12-04 ENCOUNTER — Other Ambulatory Visit: Payer: Self-pay | Admitting: Hematology & Oncology

## 2022-03-12 ENCOUNTER — Other Ambulatory Visit: Payer: Self-pay | Admitting: Hematology & Oncology

## 2022-03-20 ENCOUNTER — Other Ambulatory Visit: Payer: Self-pay | Admitting: Family

## 2022-03-27 ENCOUNTER — Other Ambulatory Visit: Payer: BLUE CROSS/BLUE SHIELD

## 2022-03-27 ENCOUNTER — Ambulatory Visit: Payer: BLUE CROSS/BLUE SHIELD | Admitting: Family

## 2022-04-03 DIAGNOSIS — R52 Pain, unspecified: Secondary | ICD-10-CM | POA: Insufficient documentation

## 2022-06-28 ENCOUNTER — Other Ambulatory Visit: Payer: Self-pay | Admitting: Hematology & Oncology

## 2022-07-25 ENCOUNTER — Other Ambulatory Visit: Payer: Self-pay

## 2022-07-25 ENCOUNTER — Encounter: Payer: Self-pay | Admitting: Hematology & Oncology

## 2022-07-25 ENCOUNTER — Encounter: Payer: Self-pay | Admitting: *Deleted

## 2022-07-25 ENCOUNTER — Inpatient Hospital Stay: Payer: Medicare PPO | Admitting: Hematology & Oncology

## 2022-07-25 ENCOUNTER — Inpatient Hospital Stay: Payer: Medicare PPO | Attending: Hematology & Oncology

## 2022-07-25 VITALS — BP 104/65 | HR 64 | Temp 97.6°F | Resp 18 | Ht 72.0 in | Wt 265.1 lb

## 2022-07-25 DIAGNOSIS — I2690 Septic pulmonary embolism without acute cor pulmonale: Secondary | ICD-10-CM

## 2022-07-25 DIAGNOSIS — Z86711 Personal history of pulmonary embolism: Secondary | ICD-10-CM | POA: Diagnosis not present

## 2022-07-25 DIAGNOSIS — I2601 Septic pulmonary embolism with acute cor pulmonale: Secondary | ICD-10-CM | POA: Diagnosis not present

## 2022-07-25 DIAGNOSIS — Z7901 Long term (current) use of anticoagulants: Secondary | ICD-10-CM | POA: Diagnosis not present

## 2022-07-25 DIAGNOSIS — D6862 Lupus anticoagulant syndrome: Secondary | ICD-10-CM | POA: Diagnosis present

## 2022-07-25 LAB — CBC WITH DIFFERENTIAL (CANCER CENTER ONLY)
Abs Immature Granulocytes: 0.01 10*3/uL (ref 0.00–0.07)
Basophils Absolute: 0.1 10*3/uL (ref 0.0–0.1)
Basophils Relative: 1 %
Eosinophils Absolute: 1.2 10*3/uL — ABNORMAL HIGH (ref 0.0–0.5)
Eosinophils Relative: 20 %
HCT: 36 % — ABNORMAL LOW (ref 39.0–52.0)
Hemoglobin: 11.5 g/dL — ABNORMAL LOW (ref 13.0–17.0)
Immature Granulocytes: 0 %
Lymphocytes Relative: 20 %
Lymphs Abs: 1.2 10*3/uL (ref 0.7–4.0)
MCH: 29 pg (ref 26.0–34.0)
MCHC: 31.9 g/dL (ref 30.0–36.0)
MCV: 90.7 fL (ref 80.0–100.0)
Monocytes Absolute: 0.4 10*3/uL (ref 0.1–1.0)
Monocytes Relative: 7 %
Neutro Abs: 3.1 10*3/uL (ref 1.7–7.7)
Neutrophils Relative %: 52 %
Platelet Count: 271 10*3/uL (ref 150–400)
RBC: 3.97 MIL/uL — ABNORMAL LOW (ref 4.22–5.81)
RDW: 14.8 % (ref 11.5–15.5)
WBC Count: 5.9 10*3/uL (ref 4.0–10.5)
nRBC: 0 % (ref 0.0–0.2)

## 2022-07-25 LAB — CMP (CANCER CENTER ONLY)
ALT: 27 U/L (ref 0–44)
AST: 26 U/L (ref 15–41)
Albumin: 4.1 g/dL (ref 3.5–5.0)
Alkaline Phosphatase: 80 U/L (ref 38–126)
Anion gap: 8 (ref 5–15)
BUN: 22 mg/dL (ref 8–23)
CO2: 26 mmol/L (ref 22–32)
Calcium: 9.2 mg/dL (ref 8.9–10.3)
Chloride: 107 mmol/L (ref 98–111)
Creatinine: 1.08 mg/dL (ref 0.61–1.24)
GFR, Estimated: >60 mL/min (ref >60)
Glucose, Bld: 107 mg/dL — ABNORMAL HIGH (ref 70–99)
Potassium: 4.5 mmol/L (ref 3.5–5.1)
Sodium: 141 mmol/L (ref 135–145)
Total Bilirubin: 0.8 mg/dL (ref 0.3–1.2)
Total Protein: 6.5 g/dL (ref 6.5–8.1)

## 2022-07-25 LAB — D-DIMER, QUANTITATIVE: D-Dimer, Quant: 2.43 ug/mL-FEU — ABNORMAL HIGH (ref 0.00–0.50)

## 2022-07-25 MED ORDER — CELECOXIB 200 MG PO CAPS: 200.0000 mg | ORAL_CAPSULE | Freq: Two times a day (BID) | ORAL | 3 refills | Status: DC

## 2022-07-25 MED ORDER — TRAMADOL HCL 50 MG PO TABS: 50.0000 mg | ORAL_TABLET | Freq: Four times a day (QID) | ORAL | 0 refills | Status: DC | PRN

## 2022-07-25 NOTE — Progress Notes (Signed)
Hematology and Oncology Follow Up Visit  Tony Frye [id number] [birth year] 62 y.o. 07/25/2022   Principle Diagnosis:  History of pulmonary embolism-post left knee surgery-01/2020 --positive lupus anticoagulant  Current Therapy:   Xarelto 10 mg p.o. daily     Interim History:  Tony Frye is back for follow-up.  We last saw him back in January.  At that time, we did do a Doppler of his leg which did not show any evidence of a thromboembolic event.  Swelling in the left knee.  He does have a Bakers cyst but his family doctor does not wish to drain this.  We did see him back in January, he did have a lupus anticoagulant test.  This could only be transient.  We will have to monitor and do serial studies on this.  He is on Xarelto at 10 mg a day.  He is doing well with this.  His last CT angiogram that was I think back in November 2022 did not show any evidence of thromboembolic disease.  His real problem is his arthritis.  His horrible arthritis.  He is on Voltaren.  I told him he can take the Voltaren as long as he takes it with food.  He has had no change in bowel or bladder habits.  He has had no nausea or vomiting.  He has had no obvious bleeding.  There is no chest wall pain.  He has had no cough.  Overall, his performance status is ECOG 1.     Medications:  Current Outpatient Medications:  .  buPROPion (WELLBUTRIN XL) 300 MG 24 hr tablet, Take 300 mg by mouth every morning., Disp: , Rfl:  .  diclofenac (VOLTAREN) 75 MG EC tablet, Take 1 tablet (75 mg total) by mouth 3 (three) times daily as needed. You must take this with food., Disp: 60 tablet, Rfl: 2 .  levocetirizine (XYZAL) 5 MG tablet, Take 5 mg by mouth every evening., Disp: , Rfl:  .  lisinopril (ZESTRIL) 10 MG tablet, Take 10 mg by mouth daily., Disp: , Rfl:  .  methocarbamol (ROBAXIN) 500 MG tablet, Take 1 tablet (500 mg total) by mouth every 6 (six) hours as needed for muscle spasms., Disp: 30 tablet, Rfl: 1 .   Multiple Vitamin (MULTIVITAMIN WITH MINERALS) TABS tablet, Take 1 tablet by mouth daily., Disp: , Rfl:  .  pantoprazole (PROTONIX) 40 MG tablet, Take 40 mg by mouth daily., Disp: , Rfl:  .  rosuvastatin (CRESTOR) 10 MG tablet, Take 10 mg by mouth daily., Disp: , Rfl:  .  tamsulosin (FLOMAX) 0.4 MG CAPS capsule, Take 0.4 mg by mouth every evening., Disp: , Rfl:  .  traMADol (ULTRAM) 50 MG tablet, Take 50 mg by mouth every 6 (six) hours as needed., Disp: , Rfl:  .  XARELTO 10 MG TABS tablet, TAKE ONE TABLET BY MOUTH EVERY DAY, Disp: 30 tablet, Rfl: 5 .  ciclopirox (PENLAC) 8 % solution, Apply 1 application topically 2 (two) times a week. (Patient not taking: Reported on 08/01/2021), Disp: , Rfl:  .  oxyCODONE-acetaminophen (PERCOCET/ROXICET) 5-325 MG tablet, Take 1-2 tablets by mouth every 4 (four) hours as needed for severe pain. (Patient not taking: Reported on 05/03/2021), Disp: 30 tablet, Rfl: 0 .  pregabalin (LYRICA) 150 MG capsule, Take 150 mg by mouth 2 (two) times daily. (Patient not taking: Reported on 08/01/2021), Disp: , Rfl:   Allergies:  Allergies  Allergen Reactions  . Penicillins Anaphylaxis    Childhood allergy, pt  states he's no longer allergic and has taken amoxicillin multiple times  . Buprenorphine Hcl Rash  . Esomeprazole Magnesium Nausea And Vomiting and Other (See Comments)  . Codeine Hives, Rash and Other (See Comments)    Pt states he's recently had this med and had no reaction  . Morphine Rash  . Morphine And Related Rash    Past Medical History, Surgical history, Social history, and Family History were reviewed and updated.  Review of Systems: Review of Systems  Constitutional: Negative.   HENT:  Negative.    Eyes: Negative.   Respiratory: Negative.    Cardiovascular:  Positive for leg swelling.  Gastrointestinal: Negative.   Endocrine: Negative.   Genitourinary: Negative.    Musculoskeletal:  Positive for back pain.  Skin: Negative.   Neurological:  Negative.   Hematological: Negative.   Psychiatric/Behavioral: Negative.      Physical Exam:  height is 6' (1.829 m) and weight is 265 lb 1.9 oz (120.3 kg). His oral temperature is 97.6 F (36.4 C). His blood pressure is 104/65 and his pulse is 64. His respiration is 18 and oxygen saturation is 97%.   Wt Readings from Last 3 Encounters:  07/25/22 265 lb 1.9 oz (120.3 kg)  08/01/21 266 lb 6.4 oz (120.8 kg)  06/19/21 265 lb (120.2 kg)    Physical Exam Vitals reviewed.  HENT:     Head: Normocephalic and atraumatic.  Eyes:     Pupils: Pupils are equal, round, and reactive to light.  Cardiovascular:     Rate and Rhythm: Normal rate and regular rhythm.     Heart sounds: Normal heart sounds.  Pulmonary:     Effort: Pulmonary effort is normal.     Breath sounds: Normal breath sounds.  Abdominal:     General: Bowel sounds are normal.     Palpations: Abdomen is soft.  Musculoskeletal:        General: No tenderness or deformity. Normal range of motion.     Cervical back: Normal range of motion.     Comments: Back exam shows the laminectomy scars in the lumbar spine.  These are healing.  He is wearing a back brace.  He has some swelling in the left lower leg.  This is more prominent in the upper portion of the left lower leg.  I cannot palpate any obvious venous cord in the leg.  He has good pulses bilaterally.  Lymphadenopathy:     Cervical: No cervical adenopathy.  Skin:    General: Skin is warm and dry.     Findings: No erythema or rash.  Neurological:     Mental Status: He is alert and oriented to person, place, and time.  Psychiatric:        Behavior: Behavior normal.        Thought Content: Thought content normal.        Judgment: Judgment normal.     Lab Results  Component Value Date   WBC 5.9 07/25/2022   HGB 11.5 (L) 07/25/2022   HCT 36.0 (L) 07/25/2022   MCV 90.7 07/25/2022   PLT 271 07/25/2022     Chemistry      Component Value Date/Time   NA 141 07/25/2022  1025   K 4.5 07/25/2022 1025   CL 107 07/25/2022 1025   CO2 26 07/25/2022 1025   BUN 22 07/25/2022 1025   CREATININE 1.08 07/25/2022 1025      Component Value Date/Time   CALCIUM 9.2 07/25/2022 1025   ALKPHOS  80 07/25/2022 1025   AST 26 07/25/2022 1025   ALT 27 07/25/2022 1025   BILITOT 0.8 07/25/2022 1025      Impression and Plan: Tony Frye is a very nice 62 year old white male.  I just feel bad that he has had all the back issues.  He just had major back surgery just 2 months ago.  He had spinal fusion.  It will be interesting to see if he does have a persistently elevated lupus anticoagulant.  He says he does take testosterone supplementation.  I think that with the testosterone supplementation and the lupus anticoagulant, we are going to have to have him on lifelong anticoagulation.  I will plan to get her back to see me in 4 months.  Clearly, his quality of life is much better when he does take testosterone.   Volanda Napoleon, MD 2/14/202411:19 AM   ADDENDUM:  (+) Lupus anticoagulant

## 2022-07-25 NOTE — Progress Notes (Signed)
Tramadol 50 mg tabs approved by plan from 1//124-06/11/23. PA# [id number].

## 2022-07-26 LAB — PTT-LA MIX: PTT-LA Mix: 44.5 s — ABNORMAL HIGH (ref 0.0–40.5)

## 2022-07-26 LAB — DRVVT MIX: dRVVT Mix: 59.1 s — ABNORMAL HIGH (ref 0.0–40.4)

## 2022-07-26 LAB — DRVVT CONFIRM: dRVVT Confirm: 1.4 ratio — ABNORMAL HIGH (ref 0.8–1.2)

## 2022-07-26 LAB — LUPUS ANTICOAGULANT PANEL
DRVVT: 71.4 s — ABNORMAL HIGH (ref 0.0–47.0)
PTT Lupus Anticoagulant: 45.9 s — ABNORMAL HIGH (ref 0.0–43.5)

## 2022-07-26 LAB — HEXAGONAL PHASE PHOSPHOLIPID: Hexagonal Phase Phospholipid: 3 s (ref 0–11)

## 2022-08-03 ENCOUNTER — Other Ambulatory Visit: Payer: Self-pay | Admitting: *Deleted

## 2022-08-03 ENCOUNTER — Telehealth: Payer: Self-pay | Admitting: *Deleted

## 2022-08-03 DIAGNOSIS — G8929 Other chronic pain: Secondary | ICD-10-CM

## 2022-08-03 NOTE — Telephone Encounter (Signed)
Patient called requesting rheumatology referral.  Dr Marin Olp notified.  Rheumatology referral made and faxed.

## 2022-09-13 DIAGNOSIS — G5603 Carpal tunnel syndrome, bilateral upper limbs: Secondary | ICD-10-CM | POA: Diagnosis not present

## 2022-09-19 DIAGNOSIS — M7122 Synovial cyst of popliteal space [Baker], left knee: Secondary | ICD-10-CM | POA: Diagnosis not present

## 2022-09-19 DIAGNOSIS — Z96652 Presence of left artificial knee joint: Secondary | ICD-10-CM | POA: Diagnosis not present

## 2022-10-11 DIAGNOSIS — Z96652 Presence of left artificial knee joint: Secondary | ICD-10-CM | POA: Diagnosis not present

## 2022-10-18 DIAGNOSIS — M5416 Radiculopathy, lumbar region: Secondary | ICD-10-CM | POA: Diagnosis not present

## 2022-10-22 DIAGNOSIS — M7541 Impingement syndrome of right shoulder: Secondary | ICD-10-CM | POA: Diagnosis not present

## 2022-10-22 DIAGNOSIS — M25511 Pain in right shoulder: Secondary | ICD-10-CM | POA: Diagnosis not present

## 2022-10-22 DIAGNOSIS — M75102 Unspecified rotator cuff tear or rupture of left shoulder, not specified as traumatic: Secondary | ICD-10-CM | POA: Diagnosis not present

## 2022-10-23 ENCOUNTER — Other Ambulatory Visit: Payer: Medicare PPO

## 2022-10-23 ENCOUNTER — Ambulatory Visit: Payer: Medicare PPO | Admitting: Hematology & Oncology

## 2022-10-23 DIAGNOSIS — M25562 Pain in left knee: Secondary | ICD-10-CM | POA: Diagnosis not present

## 2022-10-23 DIAGNOSIS — Z96652 Presence of left artificial knee joint: Secondary | ICD-10-CM | POA: Diagnosis not present

## 2022-11-07 ENCOUNTER — Other Ambulatory Visit: Payer: Self-pay | Admitting: Hematology & Oncology

## 2022-11-10 IMAGING — CT CT ANGIO CHEST
2 of 7 series · 17 of 46 positions shown · IV contrast (omnipaque)
Comparison: Portable chest 05/03/2021

CLINICAL DATA: Short of breath rule out pulmonary embolism. Back
surgery 04/27/2021. On Xarelto.

EXAM:
CT ANGIOGRAPHY CHEST WITH CONTRAST
TECHNIQUE: Multidetector CT imaging of the chest was performed using the
standard protocol during bolus administration of intravenous
contrast. Multiplanar CT image reconstructions and MIPs were
obtained to evaluate the vascular anatomy.
CONTRAST:  100mL OMNIPAQUE IOHEXOL 350 MG/ML SOLN

[Series 5: pe axial thins · axial · 0.85mm/px · z∈[+62,+313]mm · 14 of 291 slices shown]
[im 20/291  lung]
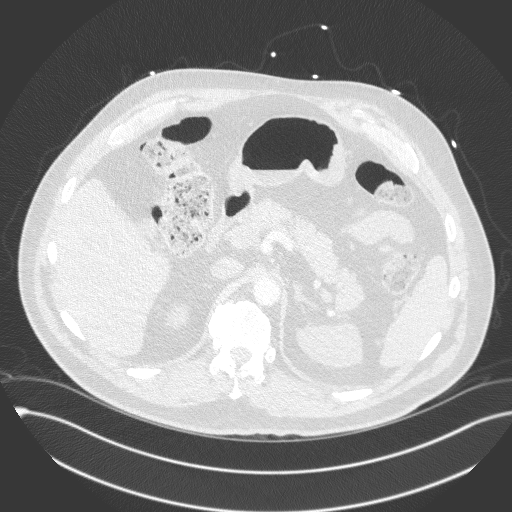
[im 39/291  soft-tissue]
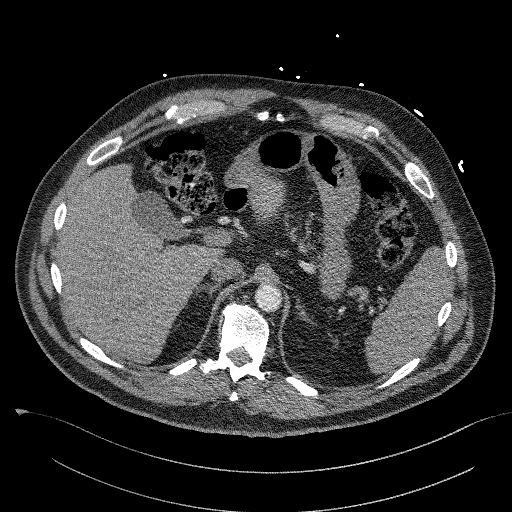
[im 59/291  lung]
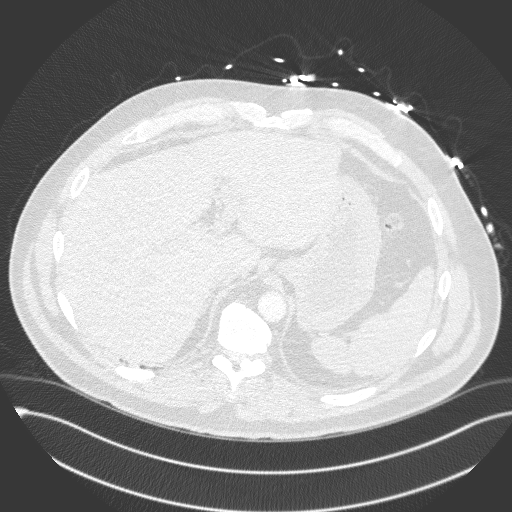
[im 78/291  soft-tissue]
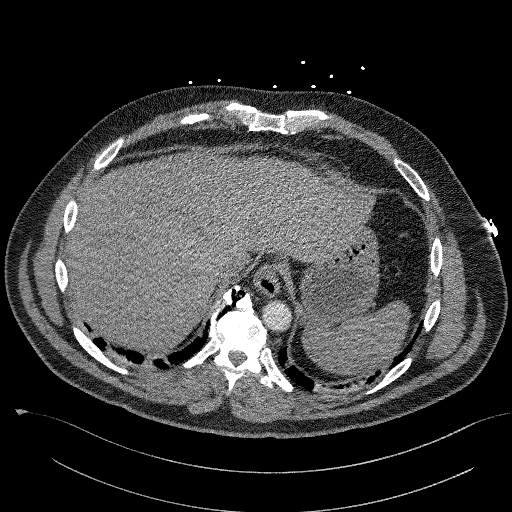
[im 97/291  lung]
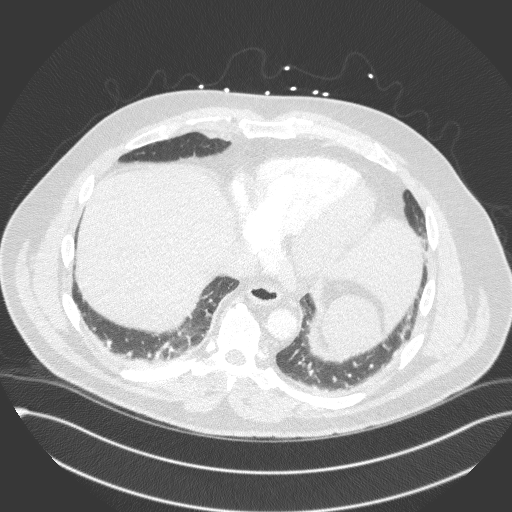
[im 117/291  soft-tissue]
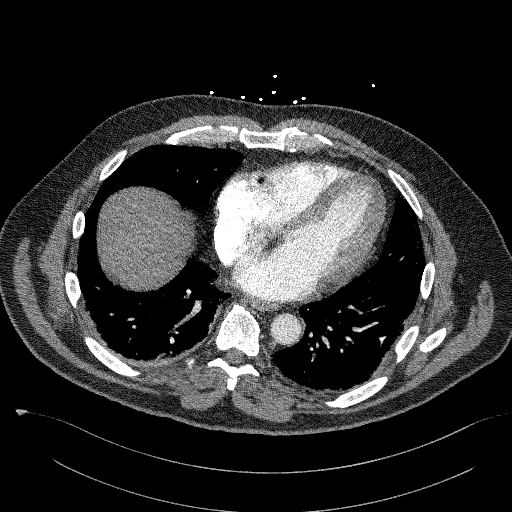
[im 136/291  lung]
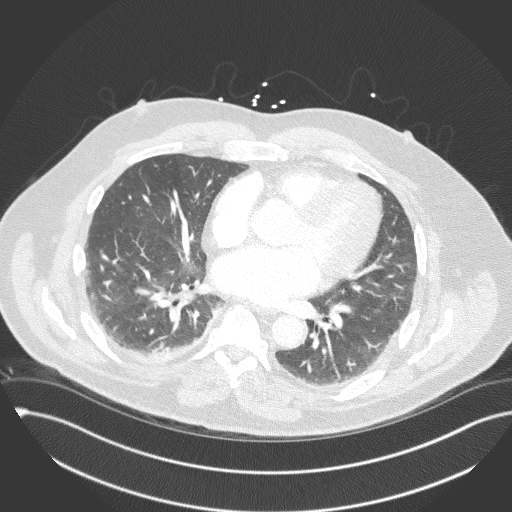
[im 155/291  soft-tissue]
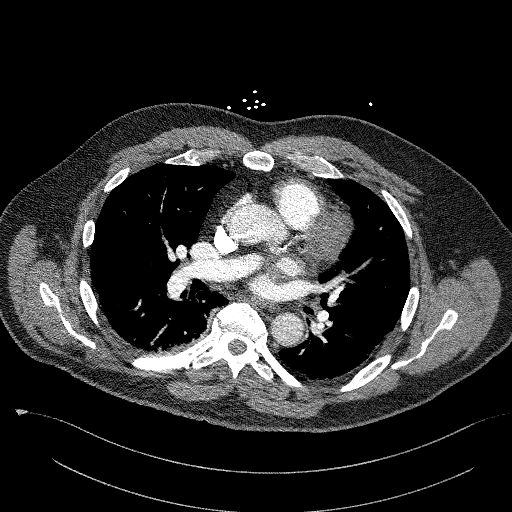
[im 175/291  lung]
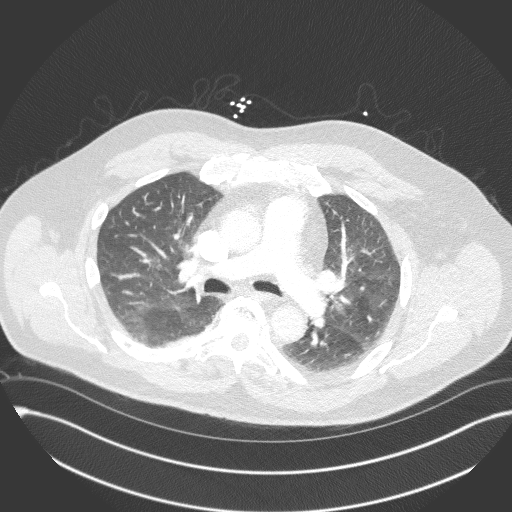
[im 194/291  soft-tissue]
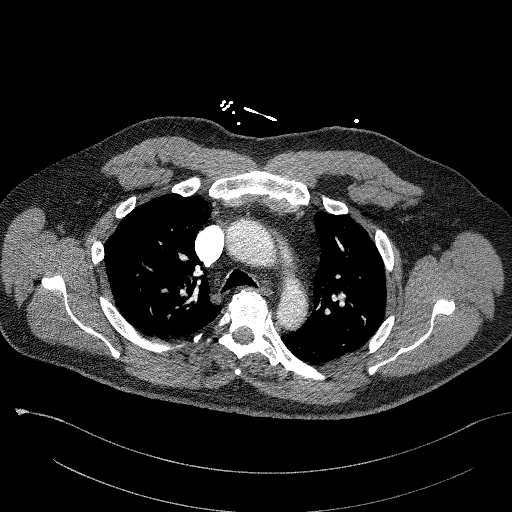
[im 213/291  lung]
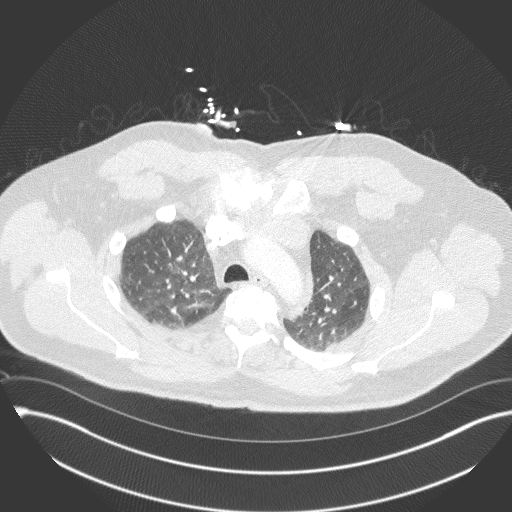
[im 233/291  soft-tissue]
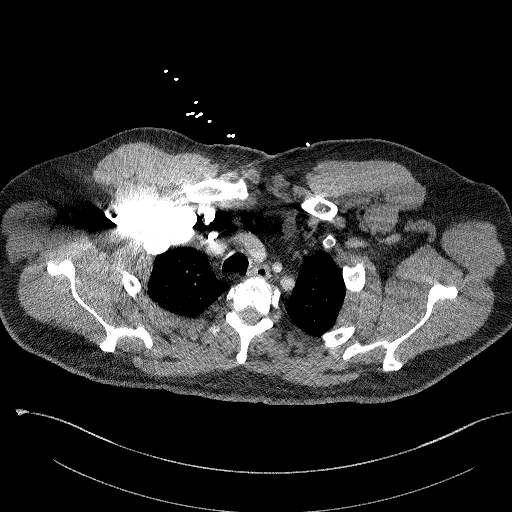
[im 252/291  lung]
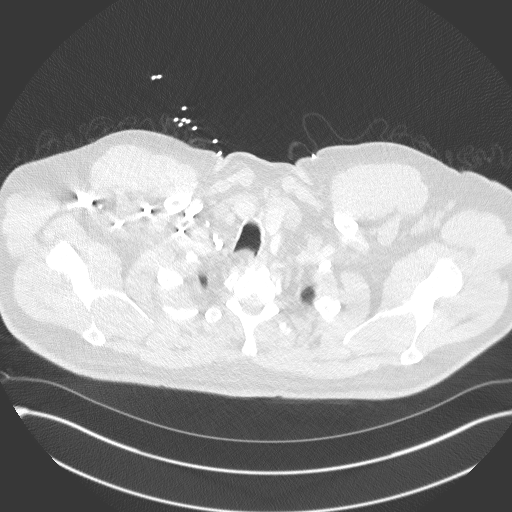
[im 271/291  soft-tissue]
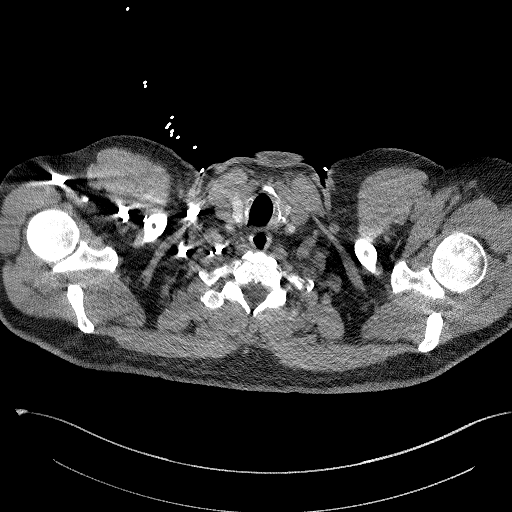

[Series 7: cor soft · coronal · 0.61mm/px · 3 of 146 slices shown]
[im 37/146  soft-tissue]
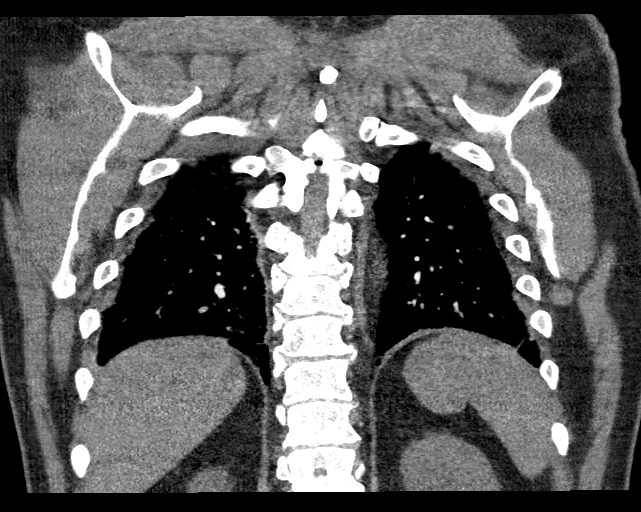
[im 73/146  soft-tissue]
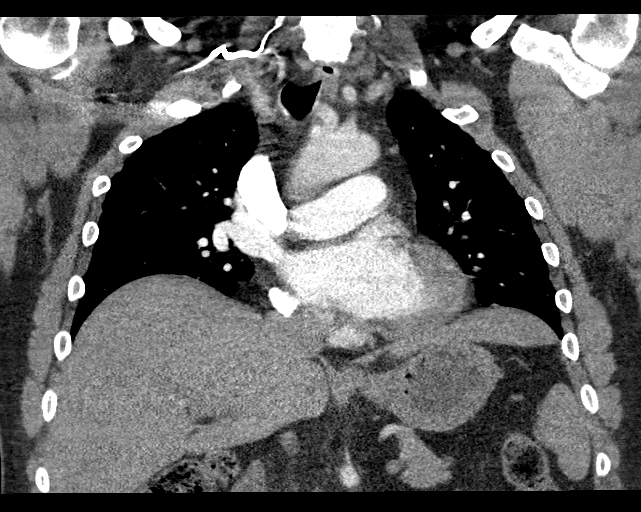
[im 109/146  soft-tissue]
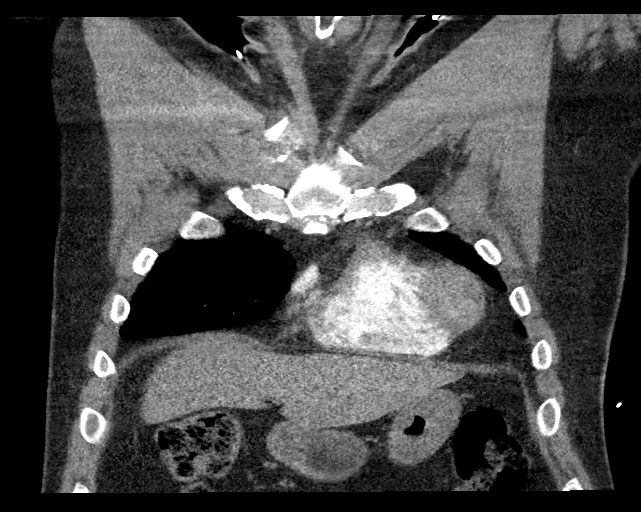

[17 of 46 positions shown; findings below may reference images not displayed]

FINDINGS: Cardiovascular: Negative for pulmonary embolism. Heart size within
normal limits. Mild coronary calcification. Mild aortic
calcification.

Mediastinum/Nodes: Small hiatal hernia. Negative for mass or
adenopathy.

Lungs/Pleura: Negative for infiltrate or effusion. Minimal dependent
atelectasis in the lung bases.

Upper Abdomen: No acute abnormality.

Musculoskeletal: No acute abnormality.

Review of the MIP images confirms the above findings.
IMPRESSION: Negative for pulmonary embolism.  No acute abnormality in the chest.

Mild atherosclerotic calcification in the coronary arteries and
aortic arch.

## 2022-11-10 IMAGING — DX DG CHEST 1V PORT
1 series · 1 of 1 positions shown · non-contrast
Comparison: 04/28/2021

CLINICAL DATA: Short of breath

EXAM:
PORTABLE CHEST 1 VIEW

[chest]
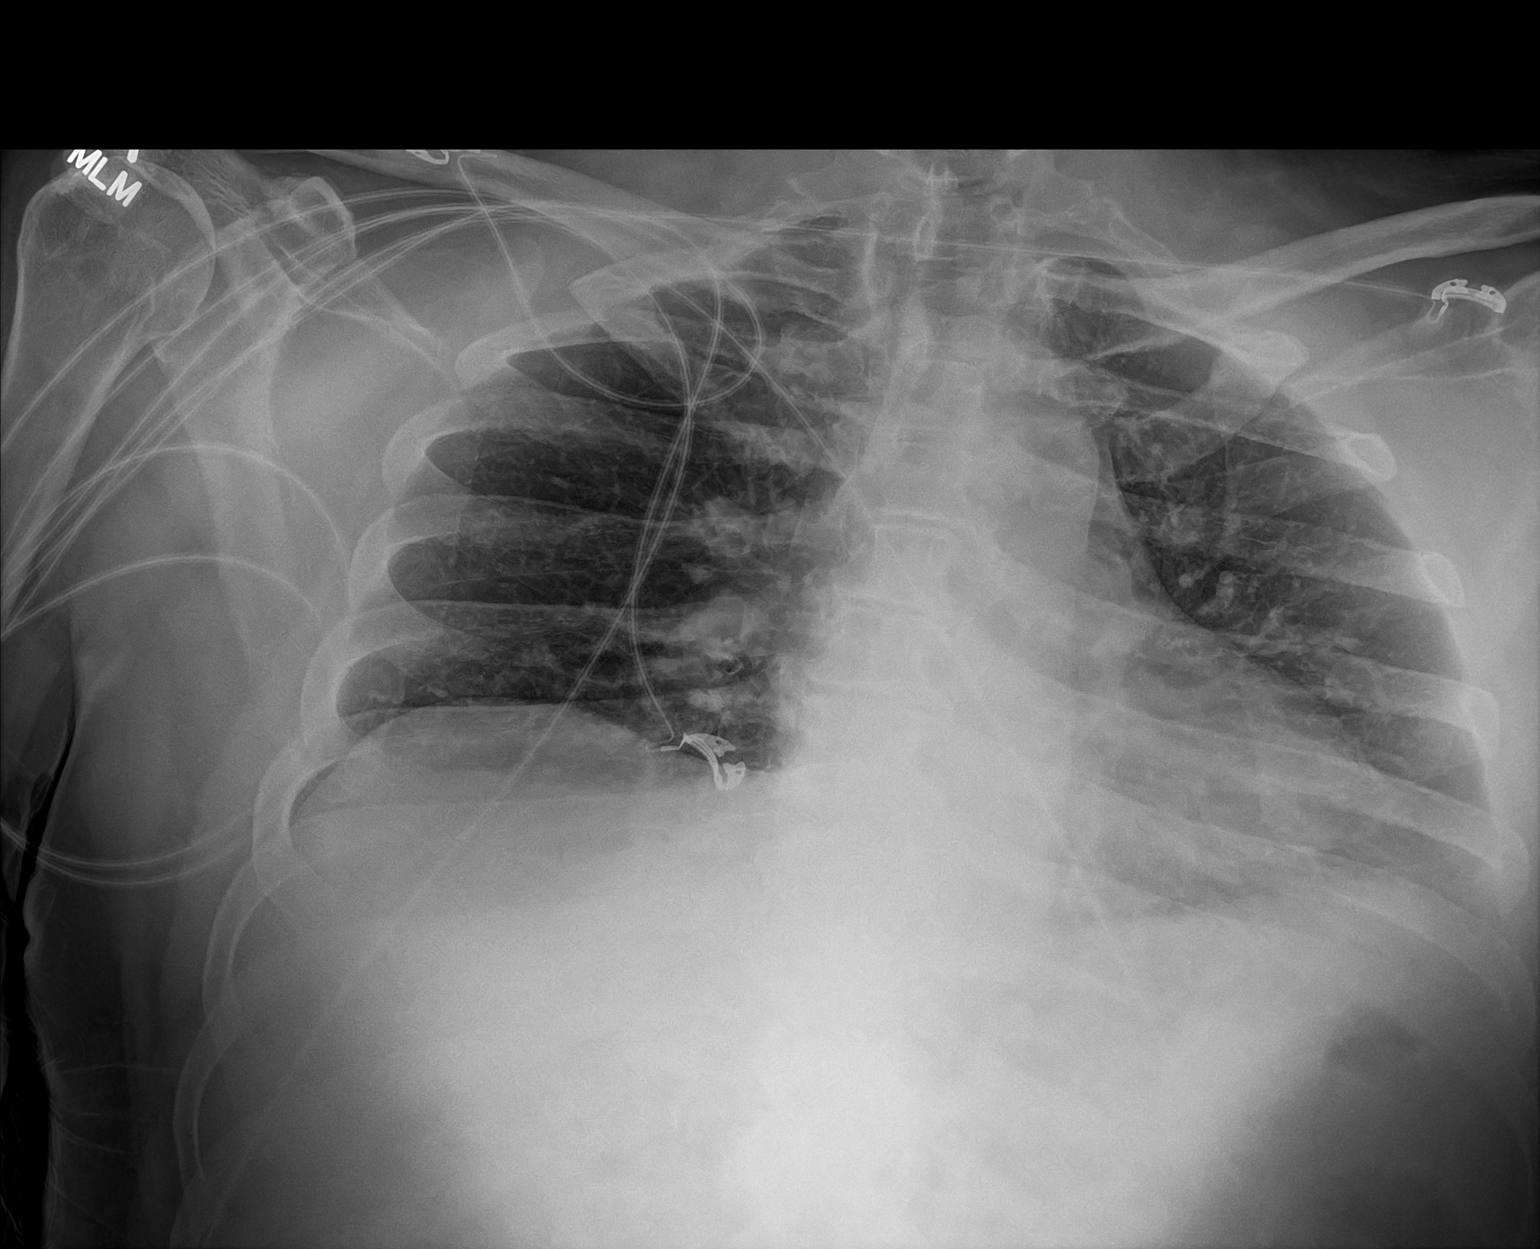

[1 of 1 positions shown; findings below may reference images not displayed]

FINDINGS: The heart size and mediastinal contours are within normal limits.
Both lungs are clear. The visualized skeletal structures are
unremarkable.
IMPRESSION: No active disease.

## 2022-11-14 ENCOUNTER — Inpatient Hospital Stay: Payer: Medicare PPO | Admitting: Hematology & Oncology

## 2022-11-14 ENCOUNTER — Inpatient Hospital Stay: Payer: Medicare PPO | Attending: Hematology & Oncology

## 2022-11-14 VITALS — BP 114/73 | HR 52 | Temp 98.0°F | Resp 17 | Ht 72.0 in | Wt 275.0 lb

## 2022-11-14 DIAGNOSIS — D6862 Lupus anticoagulant syndrome: Secondary | ICD-10-CM | POA: Insufficient documentation

## 2022-11-14 DIAGNOSIS — Z7901 Long term (current) use of anticoagulants: Secondary | ICD-10-CM | POA: Insufficient documentation

## 2022-11-14 DIAGNOSIS — Z96652 Presence of left artificial knee joint: Secondary | ICD-10-CM | POA: Diagnosis not present

## 2022-11-14 DIAGNOSIS — I2609 Other pulmonary embolism with acute cor pulmonale: Secondary | ICD-10-CM

## 2022-11-14 DIAGNOSIS — Z86711 Personal history of pulmonary embolism: Secondary | ICD-10-CM | POA: Insufficient documentation

## 2022-11-14 DIAGNOSIS — I2782 Chronic pulmonary embolism: Secondary | ICD-10-CM | POA: Diagnosis not present

## 2022-11-14 DIAGNOSIS — I2601 Septic pulmonary embolism with acute cor pulmonale: Secondary | ICD-10-CM

## 2022-11-14 LAB — CBC WITH DIFFERENTIAL (CANCER CENTER ONLY)
Abs Immature Granulocytes: 0.01 10*3/uL (ref 0.00–0.07)
Basophils Absolute: 0.1 10*3/uL (ref 0.0–0.1)
Basophils Relative: 1 %
Eosinophils Absolute: 0.3 10*3/uL (ref 0.0–0.5)
Eosinophils Relative: 5 %
HCT: 39.1 % (ref 39.0–52.0)
Hemoglobin: 12.7 g/dL — ABNORMAL LOW (ref 13.0–17.0)
Immature Granulocytes: 0 %
Lymphocytes Relative: 24 %
Lymphs Abs: 1.3 10*3/uL (ref 0.7–4.0)
MCH: 27.4 pg (ref 26.0–34.0)
MCHC: 32.5 g/dL (ref 30.0–36.0)
MCV: 84.3 fL (ref 80.0–100.0)
Monocytes Absolute: 0.5 10*3/uL (ref 0.1–1.0)
Monocytes Relative: 9 %
Neutro Abs: 3.3 10*3/uL (ref 1.7–7.7)
Neutrophils Relative %: 61 %
Platelet Count: 258 10*3/uL (ref 150–400)
RBC: 4.64 MIL/uL (ref 4.22–5.81)
RDW: 15 % (ref 11.5–15.5)
WBC Count: 5.5 10*3/uL (ref 4.0–10.5)
nRBC: 0 % (ref 0.0–0.2)

## 2022-11-14 LAB — CMP (CANCER CENTER ONLY)
ALT: 23 U/L (ref 0–44)
AST: 28 U/L (ref 15–41)
Albumin: 4.5 g/dL (ref 3.5–5.0)
Alkaline Phosphatase: 91 U/L (ref 38–126)
Anion gap: 8 (ref 5–15)
BUN: 22 mg/dL (ref 8–23)
CO2: 28 mmol/L (ref 22–32)
Calcium: 10.5 mg/dL — ABNORMAL HIGH (ref 8.9–10.3)
Chloride: 105 mmol/L (ref 98–111)
Creatinine: 1.12 mg/dL (ref 0.61–1.24)
GFR, Estimated: >60 mL/min
Glucose, Bld: 109 mg/dL — ABNORMAL HIGH (ref 70–99)
Potassium: 4.5 mmol/L (ref 3.5–5.1)
Sodium: 141 mmol/L (ref 135–145)
Total Bilirubin: 0.8 mg/dL (ref 0.3–1.2)
Total Protein: 7.5 g/dL (ref 6.5–8.1)

## 2022-11-14 LAB — D-DIMER, QUANTITATIVE: D-Dimer, Quant: 2.69 ug{FEU}/mL — ABNORMAL HIGH (ref 0.00–0.50)

## 2022-11-14 NOTE — Progress Notes (Signed)
Hematology and Oncology Follow Up Visit  Tony Frye [id number] Mar 09, 1961 63 y.o. 11/14/2022   Principle Diagnosis:  History of pulmonary embolism-post left knee surgery-01/2020 -- transition positive lupus anticoagulant  Current Therapy:   Xarelto 10 mg p.o. daily     Interim History:  Tony Frye is back for follow-up.  Unfortunately, his back is still causing social problems for him.  He has problems at Tony Frye.  He does not want surgery.  It sounds like he will need surgery but he just does not wish to have surgery that would limit his flexibility.  He is going to be seeing a Rheumatologist.  He has arthritis all over.  It is hard to say whether him having the lupus anticoagulant is a problem with his back to the arthritis.  I suppose that is possible the lupus anticoagulant might be an indicator that he may have some underlying autoimmune issue.  His D-dimer has always been on the higher side.  Today, the D-dimer is 2.69.  He is totally asymptomatic.  He has no cough or shortness of breath.  There is no chest pain.  He has had no issues with his legs.  He does have some knee swelling which is chronic.  Will have to watch him somewhat closely.  He has had no problems with bowels or bladder.  There is no bleeding.  He has had no nausea or vomiting.  Overall, I would say that his performance status is probably ECOG 1.    Medications:  Current Outpatient Medications:    buPROPion (WELLBUTRIN XL) 300 MG 24 hr tablet, Take 300 mg by mouth every morning., Disp: , Rfl:    celecoxib (CELEBREX) 200 MG capsule, TAKE ONE CAPSULE BY MOUTH TWICE DAILY, Disp: 60 capsule, Rfl: 3   ciclopirox (PENLAC) 8 % solution, Apply 1 application  topically 2 (two) times a week., Disp: , Rfl:    levocetirizine (XYZAL) 5 MG tablet, Take 5 mg by mouth every evening., Disp: , Rfl:    lisinopril (ZESTRIL) 10 MG tablet, Take 10 mg by mouth daily., Disp: , Rfl:    methocarbamol (ROBAXIN) 500 MG tablet, Take 1  tablet (500 mg total) by mouth every 6 (six) hours as needed for muscle spasms., Disp: 30 tablet, Rfl: 1   Multiple Vitamin (MULTIVITAMIN WITH MINERALS) TABS tablet, Take 1 tablet by mouth daily., Disp: , Rfl:    pantoprazole (PROTONIX) 40 MG tablet, Take 40 mg by mouth daily., Disp: , Rfl:    pregabalin (LYRICA) 150 MG capsule, Take 150 mg by mouth 2 (two) times daily., Disp: , Rfl:    rosuvastatin (CRESTOR) 10 MG tablet, Take 10 mg by mouth daily., Disp: , Rfl:    tamsulosin (FLOMAX) 0.4 MG CAPS capsule, Take 0.4 mg by mouth every evening., Disp: , Rfl:    traMADol (ULTRAM) 50 MG tablet, Take 1 tablet (50 mg total) by mouth every 6 (six) hours as needed., Disp: 120 tablet, Rfl: 0   XARELTO 10 MG TABS tablet, TAKE ONE TABLET BY MOUTH EVERY DAY, Disp: 30 tablet, Rfl: 5  Allergies:  Allergies  Allergen Reactions   Penicillins Anaphylaxis    Childhood allergy, pt states he's no longer allergic and has taken amoxicillin multiple times   Buprenorphine Hcl Rash   Esomeprazole Magnesium Nausea And Vomiting and Other (See Comments)   Codeine Hives, Rash and Other (See Comments)    Pt states he's recently had this med and had no reaction   Morphine Rash  Morphine And Codeine Rash    Past Medical History, Surgical history, Social history, and Family History were reviewed and updated.  Review of Systems: Review of Systems  Constitutional: Negative.   HENT:  Negative.    Eyes: Negative.   Respiratory: Negative.    Cardiovascular:  Positive for leg swelling.  Gastrointestinal: Negative.   Endocrine: Negative.   Genitourinary: Negative.    Musculoskeletal:  Positive for back pain.  Skin: Negative.   Neurological: Negative.   Hematological: Negative.   Psychiatric/Behavioral: Negative.      Physical Exam:  height is 6' (1.829 m) and weight is 275 lb (124.7 kg). His oral temperature is 98 F (36.7 C). His blood pressure is 114/73 and his pulse is 52 (abnormal). His respiration is 17 and  oxygen saturation is 100%.   Wt Readings from Last 3 Encounters:  11/14/22 275 lb (124.7 kg)  07/25/22 265 lb 1.9 oz (120.3 kg)  08/01/21 266 lb 6.4 oz (120.8 kg)    Physical Exam Vitals reviewed.  HENT:     Head: Normocephalic and atraumatic.  Eyes:     Pupils: Pupils are equal, round, and reactive to light.  Cardiovascular:     Rate and Rhythm: Normal rate and regular rhythm.     Heart sounds: Normal heart sounds.  Pulmonary:     Effort: Pulmonary effort is normal.     Breath sounds: Normal breath sounds.  Abdominal:     General: Bowel sounds are normal.     Palpations: Abdomen is soft.  Musculoskeletal:        General: No tenderness or deformity. Normal range of motion.     Cervical back: Normal range of motion.     Comments: Back exam shows the laminectomy scars in the lumbar spine.  These are healing.  He is wearing a back brace.  He has some swelling in the left lower leg.  This is more prominent in the upper portion of the left lower leg.  I cannot palpate any obvious venous cord in the leg.  He has good pulses bilaterally.  Lymphadenopathy:     Cervical: No cervical adenopathy.  Skin:    General: Skin is warm and dry.     Findings: No erythema or rash.  Neurological:     Mental Status: He is alert and oriented to person, place, and time.  Psychiatric:        Behavior: Behavior normal.        Thought Content: Thought content normal.        Judgment: Judgment normal.      Lab Results  Component Value Date   WBC 5.5 11/14/2022   HGB 12.7 (L) 11/14/2022   HCT 39.1 11/14/2022   MCV 84.3 11/14/2022   PLT 258 11/14/2022     Chemistry      Component Value Date/Time   NA 141 11/14/2022 1138   K 4.5 11/14/2022 1138   CL 105 11/14/2022 1138   CO2 28 11/14/2022 1138   BUN 22 11/14/2022 1138   CREATININE 1.12 11/14/2022 1138      Component Value Date/Time   CALCIUM 10.5 (H) 11/14/2022 1138   ALKPHOS 91 11/14/2022 1138   AST 28 11/14/2022 1138   ALT 23  11/14/2022 1138   BILITOT 0.8 11/14/2022 1138      Impression and Plan: Tony Frye is a very nice 62 year old white male.  He really is having a tough time with the pain.  I just feel bad for him.  Hopefully, somehow the pain can improve.  We will have to monitor his D-dimer.  Again, this could be elevated just on the basis of all the inflammation that he has.  We will go ahead and plan to get him back in September.  I think this would be reasonable.  I hope that the hip pain that he has will improve.    Josph Macho, MD 6/5/202412:57 PM

## 2022-11-20 ENCOUNTER — Encounter: Payer: Self-pay | Admitting: Internal Medicine

## 2022-11-20 ENCOUNTER — Ambulatory Visit: Payer: Medicare PPO | Attending: Internal Medicine | Admitting: Internal Medicine

## 2022-11-20 VITALS — BP 110/66 | HR 57 | Resp 14 | Ht 73.25 in | Wt 261.0 lb

## 2022-11-20 DIAGNOSIS — I2699 Other pulmonary embolism without acute cor pulmonale: Secondary | ICD-10-CM | POA: Diagnosis not present

## 2022-11-20 DIAGNOSIS — R760 Raised antibody titer: Secondary | ICD-10-CM

## 2022-11-20 DIAGNOSIS — M159 Polyosteoarthritis, unspecified: Secondary | ICD-10-CM | POA: Insufficient documentation

## 2022-11-20 DIAGNOSIS — M7918 Myalgia, other site: Secondary | ICD-10-CM | POA: Diagnosis not present

## 2022-11-20 NOTE — Progress Notes (Unsigned)
Office Visit Note  Patient: Tony Frye             Date of Birth: 1960/08/29           MRN: [id number]             PCP: Joycelyn Rua, MD Referring: Josph Macho, MD Visit Date: 11/20/2022   Subjective:  New Patient (Initial Visit) (Patient states he has an elevated D Dimer and patient states he will get surgery soon on his left shoulder because of a torn rotator cuff. Patient states he gets steroid injections in his back and had one a few weeks ago. Patient states he is having left knee pain and swelling. )   History of Present Illness: Tony Frye is a 62 y.o. male here for evaluation of chronic joint pain affecting multiple areas with history of blood clot with positive lupus anticoagulant and persistent elevated D-dimer.  He was referred by Dr. Myna Hidalgo who has been seeing him for his anticoagulation on Xarelto 10 mg daily he has been on this continuously after pulmonary embolism in 2021 provoked following left knee surgery.  He did not have any previous diagnosis of lupus associated with this.  He has degenerative arthritis of multiple areas.  A lot of problems from his chronic back pain has known degenerative disc disease and had previous lumbar laminectomy from L4-S1 and subsequently developing more problems at the L3-L4 level.  Also had bilateral total knee replacements for osteoarthritis.  Left knee has required 1 revision surgery.  Previous history of rotator cuff tear he is planned for left shoulder surgery for this.  Also had right shoulder steroid injection about 6 weeks ago that is partially helpful.  He takes Celebrex and Lyrica for joint pain and stiffness knees improve his symptoms partially.  He takes tramadol intermittently this is beneficial when he takes it. ***  Activities of Daily Living:  Patient reports morning stiffness for 2 hours.   Patient Reports nocturnal pain.  Difficulty dressing/grooming: Reports Difficulty climbing stairs: Reports Difficulty  getting out of chair: Reports Difficulty using hands for taps, buttons, cutlery, and/or writing: Reports  Review of Systems  Constitutional:  Positive for fatigue.  HENT:  Positive for mouth sores. Negative for mouth dryness.   Eyes:  Negative for dryness.  Respiratory:  Negative for shortness of breath.   Cardiovascular:  Negative for chest pain and palpitations.  Gastrointestinal:  Negative for blood in stool, constipation and diarrhea.  Endocrine: Positive for increased urination.  Genitourinary:  Positive for involuntary urination.  Musculoskeletal:  Positive for joint pain, gait problem, joint pain, joint swelling, myalgias, muscle weakness, morning stiffness, muscle tenderness and myalgias.  Skin:  Negative for color change, rash, hair loss and sensitivity to sunlight.  Allergic/Immunologic: Positive for susceptible to infections.  Neurological:  Negative for dizziness and headaches.  Hematological:  Negative for swollen glands.  Psychiatric/Behavioral:  Negative for depressed mood and sleep disturbance. The patient is not nervous/anxious.     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) 01/12/2020   Failed total left knee replacement (HCC) 12/29/2019   Bilateral carpal tunnel syndrome 11/09/2015   Primary osteoarthritis of both first carpometacarpal joints 11/09/2015   Alcohol use disorder, moderate, dependence (HCC) 07/26/2015   Mild episode of recurrent major depressive  disorder (HCC) 07/26/2015    Past Medical History:  Diagnosis Date   Cancer (HCC)    squamous cell, face   Depressed    Hiatal hernia    Hypertension    Low testosterone    OSA on CPAP    Osteoarthritis    both knees   Pulmonary embolism (HCC) 06/19/2021   Reflux esophagitis     Family History  Problem Relation Age of Onset   Cancer - Colon Mother    Cancer - Other Mother    Heart disease Father    Hypertension Maternal Grandfather    Past Surgical History:  Procedure Laterality Date   ABDOMINAL EXPOSURE N/A 04/26/2021   Procedure: ABDOMINAL EXPOSURE;  Surgeon: Cephus Shelling, MD;  Location: [facility] OR;  Service: Vascular;  Laterality: N/A;   ANTERIOR LUMBAR FUSION N/A 04/26/2021   Procedure: ANTERIOR LUMBAR INTERBODY FUSION LUMBAR 4- LUMBAR 5, LUMBAR 5- SACRUM 1 WITH INSTRUMENTATION AND ALLOGRAFT;  Surgeon: Estill Bamberg, MD;  Location: [facility] OR;  Service: Orthopedics;  Laterality: N/A;   BACK SURGERY     JOINT REPLACEMENT     KNEE ARTHROSCOPY Right    LUMBAR LAMINECTOMY  2022   partial ampu     REPLACEMENT TOTAL KNEE Left 2008   revision, August 2021   REPLACEMENT TOTAL KNEE Right 2010   Social History   Social History Narrative   Not on file   Immunization History  Administered Date(s) Administered   Influenza, Seasonal, Injecte, Preservative Fre 02/13/2018   Influenza,inj,Quad PF,6+ Mos 03/27/2019   Influenza,inj,Quad PF,6-35 Mos 03/27/2019   PFIZER(Purple Top)SARS-COV-2 Vaccination 08/27/2019, 09/17/2019, 03/29/2020     Objective: Vital Signs: BP 110/66 (BP Location: Right Arm, Patient Position: Sitting, Cuff Size: Normal)   Pulse (!) 57   Resp 14   Ht 6' 1.25" (1.861 m)   Wt 261 lb (118.4 kg)   BMI 34.20 kg/m    Physical Exam Constitutional:      Appearance: He is obese.  Eyes:     Conjunctiva/sclera: Conjunctivae normal.  Cardiovascular:     Rate and Rhythm: Normal rate and regular rhythm.  Pulmonary:     Effort: Pulmonary effort is normal.      Breath sounds: Normal breath sounds.  Lymphadenopathy:     Cervical: No cervical adenopathy.  Skin:    General: Skin is warm and dry.     Findings: Rash present.     Comments: 1+ pitting edema above ankles Hyperpigmentation on shins 1/3 to knees  Neurological:     Mental Status: He is alert.  Psychiatric:        Mood and Affect: Mood normal.      Musculoskeletal Exam:  Shoulders left abduction significantly restricted, tenderness to palpation without effusion Elbows full ROM no tenderness or swelling Wrists full ROM no tenderness or swelling Fingers 4th digit triggering, tenderness on flexor side proximal to MCP, bony nodules throughout chronic MCP joint widening without palpable swelling Knees postrugical changes, limited extension ROM slightlt b/l, no wamrth or erythema   Investigation: No additional findings.  Imaging: No results found.  Recent Labs: Lab Results  Component Value Date   WBC 5.5 11/14/2022   HGB 12.7 (L) 11/14/2022  PLT 258 11/14/2022   NA 141 11/14/2022   K 4.5 11/14/2022   CL 105 11/14/2022   CO2 28 11/14/2022   GLUCOSE 109 (H) 11/14/2022   BUN 22 11/14/2022   CREATININE 1.12 11/14/2022   BILITOT 0.8 11/14/2022   ALKPHOS 91 11/14/2022   AST 28 11/14/2022   ALT 23 11/14/2022   PROT 7.5 11/14/2022   ALBUMIN 4.5 11/14/2022   CALCIUM 10.5 (H) 11/14/2022   GFRAA  04/06/2010    >60        The eGFR has been calculated using the MDRD equation. This calculation has not been validated in all clinical situations. eGFR's persistently <60 mL/min signify possible Chronic Kidney Disease.    Speciality Comments: No specialty comments available.  Procedures:  No procedures performed Allergies: Buprenorphine hcl, Esomeprazole magnesium, Morphine, and Morphine and codeine   Assessment / Plan:     Visit Diagnoses: Generalized osteoarthritis of multiple sites - Plan: Rheumatoid factor, Sedimentation rate, Cyclic citrul peptide antibody,  IgG  Pulmonary embolism without acute cor pulmonale, unspecified chronicity, unspecified pulmonary embolism type (HCC)  Antiphospholipid antibody positive - Plan: ANA, C3 and C4, Phosphatidylserine/Prothrombin (PS/PT) Antibodies (IgG, IgM)  Myofascial pain  Orders: Orders Placed This Encounter  Procedures   ANA   C3 and C4   Rheumatoid factor   Sedimentation rate   Phosphatidylserine/Prothrombin (PS/PT) Antibodies (IgG, IgM)   Cyclic citrul peptide antibody, IgG   No orders of the defined types were placed in this encounter.   Face-to-face time spent with patient was *** minutes. Greater than 50% of time was spent in counseling and coordination of care.  Follow-Up Instructions: No follow-ups on file.   Fuller Plan, MD  Note - This record has been created using [employer].  Chart creation errors have been sought, but may not always  have been located. Such creation errors do not reflect on  the standard of medical care.

## 2022-11-22 LAB — ANTI-NUCLEAR AB-TITER (ANA TITER)

## 2022-11-22 LAB — C3 AND C4: C3 Complement: 150 mg/dL (ref 82–185)

## 2022-11-26 LAB — PHOSPHATIDYLSERINE/PROTHROMBIN (PS/PT) ANTIBODIES (IGG, IGM)
Phosphatidylserine/Prothrombin Ab (IgG): 35 U — ABNORMAL HIGH (ref <=30)
Phosphatidylserine/Prothrombin Ab (IgM): 10 U (ref <=30)

## 2022-11-26 LAB — C3 AND C4: C4 Complement: 27 mg/dL (ref 15–53)

## 2022-11-26 LAB — ANTI-NUCLEAR AB-TITER (ANA TITER)
ANA TITER: 1:80 {titer} — ABNORMAL HIGH
ANA Titer 1: 1:40 {titer} — ABNORMAL HIGH

## 2022-11-26 LAB — ANA: Anti Nuclear Antibody (ANA): POSITIVE — AB

## 2022-11-26 LAB — CYCLIC CITRUL PEPTIDE ANTIBODY, IGG: Cyclic Citrullin Peptide Ab: <16 UNITS

## 2022-11-26 LAB — SEDIMENTATION RATE: Sed Rate: 25 mm/h — ABNORMAL HIGH (ref 0–20)

## 2022-11-26 LAB — RHEUMATOID FACTOR: Rheumatoid fact SerPl-aCnc: <10 IU/mL (ref <14)

## 2022-12-25 NOTE — Progress Notes (Signed)
Office Visit Note  Patient: Tony Frye             Date of Birth: 27-Apr-1961           MRN: [id number]             PCP: Joycelyn Rua, MD Referring: Joycelyn Rua, MD Visit Date: 12/26/2022   Subjective:  Follow-up (Patient states he has excessive fatigue. )   History of Present Illness: Tony Frye is a 62 y.o. male here for follow up for chronic joint pain and fatigue associated with persistent serum inflammatory markers and lupus anticoagulant disorder.  Exam at our initial visit was consistent with generalized osteoarthritis changes laboratory testing did show mild elevation in sedimentation rate at 25 borderline positive ANA 1: 40 and phosphatidylserine IgG of 35.  Symptoms remain about the same having issues with pain and stiffness in multiple areas especially after long distance driving or otherwise spending a long time in a seated position.  Also describes just a very generalized fatigue with low exertion tolerance and energy level.  Not specifically excessive daytime sleepiness.  Previous HPI 11/20/22 Tony Frye is a 62 y.o. male here for evaluation of chronic joint pain affecting multiple areas with history of blood clot with positive lupus anticoagulant and persistent elevated D-dimer.  He was referred by Dr. Myna Hidalgo who has been seeing him for his anticoagulation on Xarelto 10 mg daily he has been on this continuously after pulmonary embolism in 2021 provoked following left knee surgery.  He did not have any previous diagnosis of lupus associated with this.  He has degenerative arthritis of multiple areas.  A lot of problems from his chronic back pain has known degenerative disc disease and had previous lumbar laminectomy from L4-S1 and subsequently developing more problems at the L3-L4 level.  Also had bilateral total knee replacements for osteoarthritis.  Left knee has required 1 revision surgery.  Previous history of rotator cuff tear he is planned for left  shoulder surgery for this.  Also had right shoulder steroid injection about 6 weeks ago that is partially helpful.  He takes Celebrex and Lyrica for joint pain and stiffness knees improve his symptoms partially.  He takes tramadol intermittently this is beneficial when he takes it.    Review of Systems  Constitutional:  Positive for fatigue.  HENT:  Positive for mouth sores. Negative for mouth dryness.   Eyes:  Negative for dryness.  Respiratory:  Negative for shortness of breath.   Cardiovascular:  Negative for chest pain and palpitations.  Gastrointestinal:  Positive for constipation and diarrhea. Negative for blood in stool.  Endocrine: Positive for increased urination.  Genitourinary:  Positive for involuntary urination.  Musculoskeletal:  Positive for joint pain, gait problem, joint pain, joint swelling, myalgias, muscle weakness, morning stiffness, muscle tenderness and myalgias.  Skin:  Positive for rash and hair loss. Negative for color change and sensitivity to sunlight.  Allergic/Immunologic: Positive for susceptible to infections.  Neurological:  Negative for dizziness and headaches.  Hematological:  Negative for swollen glands.  Psychiatric/Behavioral:  Positive for depressed mood and sleep disturbance. The patient is not nervous/anxious.     PMFS History:  Patient Active Problem List   Diagnosis Date Noted   Seronegative inflammatory arthritis 12/26/2022   Antiphospholipid antibody positive 11/20/2022   Generalized osteoarthritis of multiple sites 11/20/2022   Myofascial pain 11/20/2022   Intractable pain 04/03/2022   Pulmonary embolism (HCC) 06/19/2021   Radiculopathy 04/26/2021   Chronic back  pain 04/18/2021   Leukocytosis 01/23/2020   Persistent cough 01/23/2020   Pulmonary embolism and infarction (HCC) 01/23/2020   S/P revision of total knee, left 01/15/2020   Mechanical loosening of internal left knee prosthetic joint (HCC) 01/12/2020   Failed total left knee  replacement (HCC) 12/29/2019   Bilateral carpal tunnel syndrome 11/09/2015   Primary osteoarthritis of both first carpometacarpal joints 11/09/2015   Alcohol use disorder, moderate, dependence (HCC) 07/26/2015   Mild episode of recurrent major depressive disorder (HCC) 07/26/2015    Past Medical History:  Diagnosis Date   Cancer (HCC)    squamous cell, face   Depressed    Hiatal hernia    Hypertension    Low testosterone    OSA on CPAP    Osteoarthritis    both knees   Pulmonary embolism (HCC) 06/19/2021   Reflux esophagitis     Family History  Problem Relation Age of Onset   Cancer - Colon Mother    Cancer - Other Mother    Heart disease Father    Hypertension Maternal Grandfather    Past Surgical History:  Procedure Laterality Date   ABDOMINAL EXPOSURE N/A 04/26/2021   Procedure: ABDOMINAL EXPOSURE;  Surgeon: Cephus Shelling, MD;  Location: [facility] OR;  Service: Vascular;  Laterality: N/A;   ANTERIOR LUMBAR FUSION N/A 04/26/2021   Procedure: ANTERIOR LUMBAR INTERBODY FUSION LUMBAR 4- LUMBAR 5, LUMBAR 5- SACRUM 1 WITH INSTRUMENTATION AND ALLOGRAFT;  Surgeon: Estill Bamberg, MD;  Location: [facility] OR;  Service: Orthopedics;  Laterality: N/A;   BACK SURGERY     JOINT REPLACEMENT     KNEE ARTHROSCOPY Right    LUMBAR LAMINECTOMY  2022   partial ampu     REPLACEMENT TOTAL KNEE Left 2008   revision, August 2021   REPLACEMENT TOTAL KNEE Right 2010   Social History   Social History Narrative   Not on file   Immunization History  Administered Date(s) Administered   Influenza, Seasonal, Injecte, Preservative Fre 02/13/2018   Influenza,inj,Quad PF,6+ Mos 03/27/2019   Influenza,inj,Quad PF,6-35 Mos 03/27/2019   PFIZER(Purple Top)SARS-COV-2 Vaccination 08/27/2019, 09/17/2019, 03/29/2020     Objective: Vital Signs: BP 99/60 (BP Location: Left Arm, Patient Position: Sitting, Cuff Size: Normal)   Pulse 60   Resp 12   Ht 6\' 2"  (1.88 m)   Wt 256 lb (116.1 kg)   BMI 32.87 kg/m     Physical Exam Constitutional:      Appearance: He is obese.  Eyes:     Conjunctiva/sclera: Conjunctivae normal.  Cardiovascular:     Rate and Rhythm: Normal rate and regular rhythm.  Pulmonary:     Effort: Pulmonary effort is normal.     Breath sounds: Normal breath sounds.  Musculoskeletal:     Right lower leg: No edema.     Left lower leg: No edema.  Lymphadenopathy:     Cervical: No cervical adenopathy.  Skin:    General: Skin is warm and dry.     Findings: No rash.  Neurological:     Mental Status: He is alert.  Psychiatric:        Mood and Affect: Mood normal.      Musculoskeletal Exam:  Left shoulder abduction limited right range of motion normal, tenderness to pressure without palpable effusion Elbows full ROM no tenderness or swelling Wrists full ROM no tenderness or swelling Fingers chronic MCP joint widening without palpable effusion, palmar nodule proximal to fourth MCP joint and triggering but with range of motion intact  Knees bilateral postsurgical changes slightly restricted extension range of motion no palpable effusions  Investigation: No additional findings.  Imaging: No results found.  Recent Labs: Lab Results  Component Value Date   WBC 5.5 11/14/2022   HGB 12.7 (L) 11/14/2022   PLT 258 11/14/2022   NA 141 11/14/2022   K 4.5 11/14/2022   CL 105 11/14/2022   CO2 28 11/14/2022   GLUCOSE 109 (H) 11/14/2022   BUN 22 11/14/2022   CREATININE 1.12 11/14/2022   BILITOT 0.8 11/14/2022   ALKPHOS 91 11/14/2022   AST 28 11/14/2022   ALT 23 11/14/2022   PROT 7.5 11/14/2022   ALBUMIN 4.5 11/14/2022   CALCIUM 10.5 (H) 11/14/2022   GFRAA  04/06/2010    >60        The eGFR has been calculated using the MDRD equation. This calculation has not been validated in all clinical situations. eGFR's persistently <60 mL/min signify possible Chronic Kidney Disease.    Speciality Comments: No specialty comments available.  Procedures:  No procedures  performed Allergies: Buprenorphine hcl, Esomeprazole magnesium, Morphine, and Morphine and codeine   Assessment / Plan:     Visit Diagnoses: Seronegative inflammatory arthritis - Plan: hydroxychloroquine (PLAQUENIL) 200 MG tablet  Exam findings most consistent with structural cause of joint pain without definite synovitis but with abnormal lab test will recommend trial of steroid sparing DMARD treatment with hydroxychloroquine.  Would also have some direct benefit for clotting risk in setting of APS.  Will start hydroxychloroquine at 400 mg daily discussed risks of medication including need for retinal toxicity screening with long-term use.  If we do not see noticeable improvement within 9-month follow-up would not recommend escalating to other DMARDs without more clinical findings.  Antiphospholipid antibody positive - Plan: hydroxychloroquine (PLAQUENIL) 200 MG tablet  Multiple clots and persistent D-dimer elevation though only had 1 positive lupus anticoagulant so cannot confirm now on Xarelto treatment.  Phosphatidylserine positive but somewhat weakly so.  Adding Plaquenil as above and agree with long-term anticoagulation.  Generalized osteoarthritis of multiple sites  Definitely has widespread degenerative changes agree with continued use of Celebrex and Lyrica for symptom management.  Unfortunately also has pain at previously replaced joints as well.  Pulmonary embolism and infarction [facility])    Orders: No orders of the defined types were placed in this encounter.  Meds ordered this encounter  Medications   hydroxychloroquine (PLAQUENIL) 200 MG tablet    Sig: Take 2 tablets (400 mg total) by mouth daily.    Dispense:  180 tablet    Refill:  0     Follow-Up Instructions: Return in about 2 months (around 02/26/2023) for ?RA/APS HCQ start f/u 2mos.   Fuller Plan, MD  Note - This record has been created using [employer].  Chart creation errors have been sought, but  may not always  have been located. Such creation errors do not reflect on  the standard of medical care.

## 2022-12-26 ENCOUNTER — Ambulatory Visit: Payer: Medicare PPO | Attending: Internal Medicine | Admitting: Internal Medicine

## 2022-12-26 ENCOUNTER — Encounter: Payer: Self-pay | Admitting: Internal Medicine

## 2022-12-26 VITALS — BP 99/60 | HR 60 | Resp 12 | Ht 74.0 in | Wt 256.0 lb

## 2022-12-26 DIAGNOSIS — M1380 Other specified arthritis, unspecified site: Secondary | ICD-10-CM

## 2022-12-26 DIAGNOSIS — M159 Polyosteoarthritis, unspecified: Secondary | ICD-10-CM

## 2022-12-26 DIAGNOSIS — I2699 Other pulmonary embolism without acute cor pulmonale: Secondary | ICD-10-CM

## 2022-12-26 DIAGNOSIS — R760 Raised antibody titer: Secondary | ICD-10-CM

## 2022-12-26 MED ORDER — HYDROXYCHLOROQUINE SULFATE 200 MG PO TABS
400.0000 mg | ORAL_TABLET | Freq: Every day | ORAL | 0 refills | Status: DC
Start: 2022-12-26 — End: 2023-02-27

## 2022-12-26 NOTE — Patient Instructions (Signed)
Hydroxychloroquine Tablets What is this medication? HYDROXYCHLOROQUINE (hye drox ee KLOR oh kwin) treats autoimmune conditions, such as rheumatoid arthritis and lupus. It works by slowing down an overactive immune system. It may also be used to prevent and treat malaria. It works by killing the parasite that causes malaria. It belongs to a group of medications called DMARDs. This medicine may be used for other purposes; ask your health care provider or pharmacist if you have questions. COMMON BRAND NAME(S): Plaquenil, Quineprox What should I tell my care team before I take this medication? They need to know if you have any of these conditions: Diabetes Eye disease, vision problems Frequently drink alcohol G6PD deficiency Heart disease Irregular heartbeat or rhythm Kidney disease Liver disease Porphyria Psoriasis An unusual or allergic reaction to hydroxychloroquine, other medications, foods, dyes, or preservatives Pregnant or trying to get pregnant Breastfeeding How should I use this medication? Take this medication by mouth with water. Take it as directed on the prescription label. Do not cut, crush, or chew this medication. Swallow the tablets whole. Take it with food. Do not take it more than directed. Take all of this medication unless your care team tells you to stop it early. Keep taking it even if you think you are better. Take products with antacids in them at a different time of day than this medication. Take this medication 4 hours before or 4 hours after antacids. Talk to your care team if you have questions. Talk to your care team about the use of this medication in children. While this medication may be prescribed for selected conditions, precautions do apply. Overdosage: If you think you have taken too much of this medicine contact a poison control center or emergency room at once. NOTE: This medicine is only for you. Do not share this medicine with others. What if I miss a  dose? If you miss a dose, take it as soon as you can. If it is almost time for your next dose, take only that dose. Do not take double or extra doses. What may interact with this medication? Do not take this medication with any of the following: Cisapride Dronedarone Pimozide Thioridazine This medication may also interact with the following: Ampicillin Antacids Cimetidine Cyclosporine Digoxin Kaolin Medications for diabetes, such as insulin, glipizide, glyburide Medications for seizures, such as carbamazepine, phenobarbital, phenytoin Mefloquine Methotrexate Other medications that cause heart rhythm changes Praziquantel This list may not describe all possible interactions. Give your health care provider a list of all the medicines, herbs, non-prescription drugs, or dietary supplements you use. Also tell them if you smoke, drink alcohol, or use illegal drugs. Some items may interact with your medicine. What should I watch for while using this medication? Visit your care team for regular checks on your progress. Tell your care team if your symptoms do not start to get better or if they get worse. You may need blood work done while you are taking this medication. If you take other medications that can affect heart rhythm, you may need more testing. Talk to your care team if you have questions. Your vision may be tested before and during use of this medication. Tell your care team right away if you have any change in your eyesight. This medication may cause serious skin reactions. They can happen weeks to months after starting the medication. Contact your care team right away if you notice fevers or flu-like symptoms with a rash. The rash may be red or purple and then turn   into blisters or peeling of the skin. Or, you might notice a red rash with swelling of the face, lips or lymph nodes in your neck or under your arms. If you or your family notice any changes in your behavior, such as new or  worsening depression, thoughts of harming yourself, anxiety, or other unusual or disturbing thoughts, or memory loss, call your care team right away. What side effects may I notice from receiving this medication? Side effects that you should report to your care team as soon as possible: Allergic reactions--skin rash, itching, hives, swelling of the face, lips, tongue, or throat Aplastic anemia--unusual weakness or fatigue, dizziness, headache, trouble breathing, increased bleeding or bruising Change in vision Heart rhythm changes--fast or irregular heartbeat, dizziness, feeling faint or lightheaded, chest pain, trouble breathing Infection--fever, chills, cough, or sore throat Low blood sugar (hypoglycemia)--tremors or shaking, anxiety, sweating, cold or clammy skin, confusion, dizziness, rapid heartbeat Muscle injury--unusual weakness or fatigue, muscle pain, dark yellow or brown urine, decrease in amount of urine Pain, tingling, or numbness in the hands or feet Rash, fever, and swollen lymph nodes Redness, blistering, peeling, or loosening of the skin, including inside the mouth Thoughts of suicide or self-harm, worsening mood, or feelings of depression Unusual bruising or bleeding Side effects that usually do not require medical attention (report to your care team if they continue or are bothersome): Diarrhea Headache Nausea Stomach pain Vomiting This list may not describe all possible side effects. Call your doctor for medical advice about side effects. You may report side effects to FDA at 1-800-FDA-1088. Where should I keep my medication? Keep out of the reach of children and pets. Store at room temperature up to 30 degrees C (86 degrees F). Protect from light. Get rid of any unused medication after the expiration date. To get rid of medications that are no longer needed or have expired: Take the medication to a medication take-back program. Check with your pharmacy or law enforcement  to find a location. If you cannot return the medication, check the label or package insert to see if the medication should be thrown out in the garbage or flushed down the toilet. If you are not sure, ask your care team. If it is safe to put it in the trash, empty the medication out of the container. Mix the medication with cat litter, dirt, coffee grounds, or other unwanted substance. Seal the mixture in a bag or container. Put it in the trash. NOTE: This sheet is a summary. It may not cover all possible information. If you have questions about this medicine, talk to your doctor, pharmacist, or health care provider.  2024 Elsevier/Gold Standard (2021-12-04 00:00:00)  

## 2022-12-27 DIAGNOSIS — G5601 Carpal tunnel syndrome, right upper limb: Secondary | ICD-10-CM | POA: Diagnosis not present

## 2023-01-02 ENCOUNTER — Other Ambulatory Visit: Payer: Self-pay | Admitting: Hematology & Oncology

## 2023-01-08 DIAGNOSIS — R110 Nausea: Secondary | ICD-10-CM | POA: Diagnosis not present

## 2023-01-08 DIAGNOSIS — R197 Diarrhea, unspecified: Secondary | ICD-10-CM | POA: Diagnosis not present

## 2023-01-10 DIAGNOSIS — G5601 Carpal tunnel syndrome, right upper limb: Secondary | ICD-10-CM | POA: Diagnosis not present

## 2023-02-12 DIAGNOSIS — D225 Melanocytic nevi of trunk: Secondary | ICD-10-CM | POA: Diagnosis not present

## 2023-02-12 DIAGNOSIS — C44229 Squamous cell carcinoma of skin of left ear and external auricular canal: Secondary | ICD-10-CM | POA: Diagnosis not present

## 2023-02-12 DIAGNOSIS — D044 Carcinoma in situ of skin of scalp and neck: Secondary | ICD-10-CM | POA: Diagnosis not present

## 2023-02-12 DIAGNOSIS — L578 Other skin changes due to chronic exposure to nonionizing radiation: Secondary | ICD-10-CM | POA: Diagnosis not present

## 2023-02-13 ENCOUNTER — Inpatient Hospital Stay: Payer: Medicare PPO | Admitting: Hematology & Oncology

## 2023-02-13 ENCOUNTER — Ambulatory Visit: Payer: Medicare PPO

## 2023-02-13 ENCOUNTER — Inpatient Hospital Stay: Payer: Medicare PPO | Attending: Hematology & Oncology

## 2023-02-13 ENCOUNTER — Encounter: Payer: Self-pay | Admitting: Hematology & Oncology

## 2023-02-13 VITALS — BP 122/73 | HR 54 | Temp 97.6°F | Resp 20 | Ht 72.0 in | Wt 260.8 lb

## 2023-02-13 DIAGNOSIS — Z7901 Long term (current) use of anticoagulants: Secondary | ICD-10-CM | POA: Diagnosis not present

## 2023-02-13 DIAGNOSIS — D6859 Other primary thrombophilia: Secondary | ICD-10-CM

## 2023-02-13 DIAGNOSIS — I2782 Chronic pulmonary embolism: Secondary | ICD-10-CM

## 2023-02-13 DIAGNOSIS — D6862 Lupus anticoagulant syndrome: Secondary | ICD-10-CM | POA: Diagnosis not present

## 2023-02-13 LAB — CBC WITH DIFFERENTIAL (CANCER CENTER ONLY)
Abs Immature Granulocytes: 0.01 10*3/uL (ref 0.00–0.07)
Basophils Absolute: 0.1 10*3/uL (ref 0.0–0.1)
Basophils Relative: 1 %
Eosinophils Absolute: 0.2 10*3/uL (ref 0.0–0.5)
Eosinophils Relative: 4 %
HCT: 39.2 % (ref 39.0–52.0)
Hemoglobin: 12.3 g/dL — ABNORMAL LOW (ref 13.0–17.0)
Immature Granulocytes: 0 %
Lymphocytes Relative: 21 %
Lymphs Abs: 1.2 10*3/uL (ref 0.7–4.0)
MCH: 28.1 pg (ref 26.0–34.0)
MCHC: 31.4 g/dL (ref 30.0–36.0)
MCV: 89.5 fL (ref 80.0–100.0)
Monocytes Absolute: 0.5 10*3/uL (ref 0.1–1.0)
Monocytes Relative: 9 %
Neutro Abs: 3.8 10*3/uL (ref 1.7–7.7)
Neutrophils Relative %: 65 %
Platelet Count: 208 10*3/uL (ref 150–400)
RBC: 4.38 MIL/uL (ref 4.22–5.81)
RDW: 14.2 % (ref 11.5–15.5)
WBC Count: 5.8 10*3/uL (ref 4.0–10.5)
nRBC: 0 % (ref 0.0–0.2)

## 2023-02-13 LAB — CMP (CANCER CENTER ONLY)
ALT: 18 U/L (ref 0–44)
AST: 23 U/L (ref 15–41)
Albumin: 4.4 g/dL (ref 3.5–5.0)
Alkaline Phosphatase: 88 U/L (ref 38–126)
Anion gap: 7 (ref 5–15)
BUN: 25 mg/dL — ABNORMAL HIGH (ref 8–23)
CO2: 27 mmol/L (ref 22–32)
Calcium: 9.3 mg/dL (ref 8.9–10.3)
Chloride: 106 mmol/L (ref 98–111)
Creatinine: 1.19 mg/dL (ref 0.61–1.24)
GFR, Estimated: >60 mL/min (ref >60)
Glucose, Bld: 116 mg/dL — ABNORMAL HIGH (ref 70–99)
Potassium: 4.6 mmol/L (ref 3.5–5.1)
Sodium: 140 mmol/L (ref 135–145)
Total Bilirubin: 0.9 mg/dL (ref 0.3–1.2)
Total Protein: 6.9 g/dL (ref 6.5–8.1)

## 2023-02-13 LAB — D-DIMER, QUANTITATIVE: D-Dimer, Quant: 2.97 ug{FEU}/mL — ABNORMAL HIGH (ref 0.00–0.50)

## 2023-02-14 LAB — LUPUS ANTICOAGULANT PANEL
DRVVT: 70.6 s — ABNORMAL HIGH (ref 0.0–47.0)
PTT Lupus Anticoagulant: 43.7 s — ABNORMAL HIGH (ref 0.0–43.5)

## 2023-02-14 LAB — DRVVT CONFIRM: dRVVT Confirm: 1.3 ratio — ABNORMAL HIGH (ref 0.8–1.2)

## 2023-02-14 LAB — DRVVT MIX: dRVVT Mix: 55.6 s — ABNORMAL HIGH (ref 0.0–40.4)

## 2023-02-14 LAB — HEXAGONAL PHASE PHOSPHOLIPID: Hexagonal Phase Phospholipid: 5 s (ref 0–11)

## 2023-02-14 LAB — PTT-LA MIX: PTT-LA Mix: 41.1 s — ABNORMAL HIGH (ref 0.0–40.5)

## 2023-02-14 NOTE — Progress Notes (Signed)
Office Visit Note  Patient: Tony Frye             Date of Birth: 1960-06-27           MRN: [id number]             PCP: Joycelyn Rua, MD Referring: Joycelyn Rua, MD Visit Date: 02/27/2023   Subjective:  Follow-up (Improving)   History of Present Illness: Tony Frye is a 62 y.o. male here for follow up for chronic joint pain and fatigue associated with persistent serum inflammatory markers and lupus anticoagulant disorder.  Since starting hydroxychloroquine he noticed a significant improvement in joint pain and stiffness although he still has quite a bit in his back first thing in the morning and especially when sitting for prolonged time.  Bilateral trigger fingers worst at right fourth digit has got little more bothersome especially gets stuck and has trouble with that first thing in the morning.  He had bilateral carpal tunnel release surgery with Dr. Orlan Leavens which went well but has been slow to fully heal still gets soreness with pressure on his hands.  He had diarrhea after starting hydroxychloroquine so interrupted his treatment for about 2 weeks and noticed worsening symptoms at that time but has been tolerating it well since resuming.  Previous HPI 12/26/2022 Tony GACKE is a 62 y.o. male here for follow up for chronic joint pain and fatigue associated with persistent serum inflammatory markers and lupus anticoagulant disorder.  Exam at our initial visit was consistent with generalized osteoarthritis changes laboratory testing did show mild elevation in sedimentation rate at 25 borderline positive ANA 1: 40 and phosphatidylserine IgG of 35.  Symptoms remain about the same having issues with pain and stiffness in multiple areas especially after long distance driving or otherwise spending a long time in a seated position.  Also describes just a very generalized fatigue with low exertion tolerance and energy level.  Not specifically excessive daytime sleepiness.    Previous HPI 11/20/22 Tony Frye is a 62 y.o. male here for evaluation of chronic joint pain affecting multiple areas with history of blood clot with positive lupus anticoagulant and persistent elevated D-dimer.  He was referred by Dr. Myna Hidalgo who has been seeing him for his anticoagulation on Xarelto 10 mg daily he has been on this continuously after pulmonary embolism in 2021 provoked following left knee surgery.  He did not have any previous diagnosis of lupus associated with this.  He has degenerative arthritis of multiple areas.  A lot of problems from his chronic back pain has known degenerative disc disease and had previous lumbar laminectomy from L4-S1 and subsequently developing more problems at the L3-L4 level.  Also had bilateral total knee replacements for osteoarthritis.  Left knee has required 1 revision surgery.  Previous history of rotator cuff tear he is planned for left shoulder surgery for this.  Also had right shoulder steroid injection about 6 weeks ago that is partially helpful.  He takes Celebrex and Lyrica for joint pain and stiffness knees improve his symptoms partially.  He takes tramadol intermittently this is beneficial when he takes it.   Review of Systems  Constitutional:  Positive for fatigue.  HENT:  Positive for mouth sores. Negative for mouth dryness.   Eyes:  Negative for dryness.  Respiratory:  Negative for shortness of breath.   Cardiovascular:  Negative for chest pain and palpitations.  Gastrointestinal:  Negative for blood in stool, constipation and diarrhea.  Endocrine: Positive  for increased urination.  Genitourinary:  Positive for involuntary urination.  Musculoskeletal:  Positive for joint pain, gait problem, joint pain, joint swelling, myalgias, muscle weakness, morning stiffness, muscle tenderness and myalgias.  Skin:  Positive for hair loss. Negative for color change, rash and sensitivity to sunlight.  Allergic/Immunologic: Negative for susceptible  to infections.  Neurological:  Negative for dizziness and headaches.  Hematological:  Negative for swollen glands.  Psychiatric/Behavioral:  Negative for depressed mood and sleep disturbance. The patient is not nervous/anxious.     PMFS History:  Patient Active Problem List   Diagnosis Date Noted   Seronegative inflammatory arthritis 12/26/2022   Antiphospholipid antibody positive 11/20/2022   Generalized osteoarthritis of multiple sites 11/20/2022   Myofascial pain 11/20/2022   Intractable pain 04/03/2022   Pulmonary embolism (HCC) 06/19/2021   Radiculopathy 04/26/2021   Chronic back pain 04/18/2021   Leukocytosis 01/23/2020   Persistent cough 01/23/2020   Pulmonary embolism and infarction (HCC) 01/23/2020   S/P revision of total knee, left 01/15/2020   Mechanical loosening of internal left knee prosthetic joint (HCC) 01/12/2020   Failed total left knee replacement (HCC) 12/29/2019   Bilateral carpal tunnel syndrome 11/09/2015   Primary osteoarthritis of both first carpometacarpal joints 11/09/2015   Alcohol use disorder, moderate, dependence (HCC) 07/26/2015   Mild episode of recurrent major depressive disorder (HCC) 07/26/2015    Past Medical History:  Diagnosis Date   Cancer (HCC)    squamous cell, face   Depressed    Hiatal hernia    Hypertension    Low testosterone    OSA on CPAP    Osteoarthritis    both knees   Pulmonary embolism (HCC) 06/19/2021   Reflux esophagitis     Family History  Problem Relation Age of Onset   Cancer - Colon Mother    Cancer - Other Mother    Heart disease Father    Hypertension Maternal Grandfather    Past Surgical History:  Procedure Laterality Date   ABDOMINAL EXPOSURE N/A 04/26/2021   Procedure: ABDOMINAL EXPOSURE;  Surgeon: Cephus Shelling, MD;  Location: [facility] OR;  Service: Vascular;  Laterality: N/A;   ANTERIOR LUMBAR FUSION N/A 04/26/2021   Procedure: ANTERIOR LUMBAR INTERBODY FUSION LUMBAR 4- LUMBAR 5, LUMBAR 5- SACRUM 1  WITH INSTRUMENTATION AND ALLOGRAFT;  Surgeon: Estill Bamberg, MD;  Location: [facility] OR;  Service: Orthopedics;  Laterality: N/A;   BACK SURGERY     JOINT REPLACEMENT     KNEE ARTHROSCOPY Right    LUMBAR LAMINECTOMY  2022   partial ampu     REPLACEMENT TOTAL KNEE Left 2008   revision, August 2021   REPLACEMENT TOTAL KNEE Right 2010   SQUAMOUS CELL CARCINOMA EXCISION     Social History   Social History Narrative   Not on file   Immunization History  Administered Date(s) Administered   Influenza, Seasonal, Injecte, Preservative Fre 02/13/2018   Influenza,inj,Quad PF,6+ Mos 03/27/2019   Influenza,inj,Quad PF,6-35 Mos 03/27/2019   PFIZER(Purple Top)SARS-COV-2 Vaccination 08/27/2019, 09/17/2019, 03/29/2020     Objective: Vital Signs: BP 114/74 (BP Location: Left Arm, Patient Position: Sitting, Cuff Size: Normal)   Pulse (!) 57   Resp 16   Ht 6\' 2"  (1.88 m)   Wt 241 lb (109.3 kg)   BMI 30.94 kg/m    Physical Exam Eyes:     Conjunctiva/sclera: Conjunctivae normal.  Cardiovascular:     Rate and Rhythm: Normal rate and regular rhythm.  Pulmonary:     Effort: Pulmonary effort is  normal.     Breath sounds: Normal breath sounds.  Musculoskeletal:     Right lower leg: No edema.     Left lower leg: Edema present.  Skin:    General: Skin is warm and dry.     Findings: No rash.  Neurological:     Mental Status: He is alert.  Psychiatric:        Mood and Affect: Mood normal.      Musculoskeletal Exam:  Shoulders full ROM right shoulder slow/guarding with active ROM, no focal tenderness to pressure Elbows full ROM no tenderness or swelling Wrists full ROM no tenderness or swelling Fingers full ROM chronic bony widening MCPs and DIP heberdon's nodes, triggering right 4th digit and tenderness on flexor tendon Knees full ROM chronic postsurgical changes   Investigation: No additional findings.  Imaging: No results found.  Recent Labs: Lab Results  Component Value Date    WBC 5.8 02/13/2023   HGB 12.3 (L) 02/13/2023   PLT 208 02/13/2023   NA 140 02/13/2023   K 4.6 02/13/2023   CL 106 02/13/2023   CO2 27 02/13/2023   GLUCOSE 116 (H) 02/13/2023   BUN 25 (H) 02/13/2023   CREATININE 1.19 02/13/2023   BILITOT 0.9 02/13/2023   ALKPHOS 88 02/13/2023   AST 23 02/13/2023   ALT 18 02/13/2023   PROT 6.9 02/13/2023   ALBUMIN 4.4 02/13/2023   CALCIUM 9.3 02/13/2023   GFRAA  04/06/2010    >60        The eGFR has been calculated using the MDRD equation. This calculation has not been validated in all clinical situations. eGFR's persistently <60 mL/min signify possible Chronic Kidney Disease.    Speciality Comments: No specialty comments available.  Procedures:  No procedures performed Allergies: Buprenorphine hcl, Esomeprazole magnesium, Morphine, and Morphine and codeine   Assessment / Plan:     Visit Diagnoses: Seronegative inflammatory arthritis - Plan: Sedimentation rate, C-reactive protein, hydroxychloroquine (PLAQUENIL) 200 MG tablet  He reports a significant improvement in joint pain and stiffness since starting the hydroxychloroquine.  Also on Celebrex 200 mg twice daily which is helping.  He never had much peripheral joint synovitis so no observable change there.  Will recheck his 7 Tatian rate and CRP for inflammatory activity monitoring.  High risk medication use - hydroxychloroquine at 400 mg daily  Recent blood count and metabolic panel which looked fine.  Discussed discussed getting a baseline eye exam for retinal toxicity monitoring on hydroxychloroquine.  He was scheduled for a regular annual eye exam next year but will reach out about checking this within the next few months.  Antiphospholipid antibody positive - now on Xarelto treatment - Plan: Phosphatidylserine/Prothrombin (PS/PT) Antibodies (IgG, IgM), hydroxychloroquine (PLAQUENIL) 200 MG tablet  Recently had repeated lupus anticoagulant testing that was confirmed positive although  this is less specific on Xarelto treatment.  Will also recheck the phosphatidylserine antibody titer which was positive.  Agree with the current plan for indefinite oral anticoagulation.  Generalized osteoarthritis of multiple sites - continued use of Celebrex and Lyrica for symptom management.  Pulmonary embolism and infarction [facility])  Orders: Orders Placed This Encounter  Procedures   Sedimentation rate   C-reactive protein   Phosphatidylserine/Prothrombin (PS/PT) Antibodies (IgG, IgM)   Meds ordered this encounter  Medications   hydroxychloroquine (PLAQUENIL) 200 MG tablet    Sig: Take 2 tablets (400 mg total) by mouth daily.    Dispense:  180 tablet    Refill:  1  Follow-Up Instructions: Return in about 6 months (around 08/27/2023) for RA/APS on HCQ f/u 6mos.   Fuller Plan, MD  Note - This record has been created using [employer].  Chart creation errors have been sought, but may not always  have been located. Such creation errors do not reflect on  the standard of medical care.

## 2023-02-25 NOTE — Progress Notes (Signed)
Hematology and Oncology Follow Up Visit  Tony Frye [id number] 11-Feb-1961 62 y.o. 02/25/2023   Principle Diagnosis:  History of pulmonary embolism-post left knee surgery-01/2020 -- transition positive lupus anticoagulant  Current Therapy:   Xarelto 10 mg p.o. daily     Interim History:  Tony Frye is back for follow-up.  We last saw him back in June.  At that time, he is having back problems.  He saw him back problems.  She has issues in the lower back at L3-L4.  He has seen Rheumatology.  He was put on hydroxychloroquine.  Hopefully, this will help him out.  He has had a decent summer.  Again, he is limited by his poor back.  He has had no fever.  He has had no nausea or vomiting.  He is on Xarelto 10 mg daily.  He has tested positive for lupus anticoagulant.  He did test positive today.  He has had no obvious change in bowel bladder habits.  He has had no rashes.  He has had no leg swelling.  Overall, I would say his performance status is probably ECOG 1.   Medications:  Current Outpatient Medications:    buPROPion (WELLBUTRIN XL) 300 MG 24 hr tablet, Take 300 mg by mouth every morning., Disp: , Rfl:    celecoxib (CELEBREX) 200 MG capsule, TAKE ONE CAPSULE BY MOUTH TWICE DAILY, Disp: 60 capsule, Rfl: 3   hydroxychloroquine (PLAQUENIL) 200 MG tablet, Take 2 tablets (400 mg total) by mouth daily., Disp: 180 tablet, Rfl: 0   levocetirizine (XYZAL) 5 MG tablet, Take 5 mg by mouth every evening., Disp: , Rfl:    lisinopril (ZESTRIL) 10 MG tablet, Take 10 mg by mouth daily., Disp: , Rfl:    Multiple Vitamin (MULTIVITAMIN WITH MINERALS) TABS tablet, Take 1 tablet by mouth daily., Disp: , Rfl:    OVER THE COUNTER MEDICATION, daily. CLA Supplement, Disp: , Rfl:    pantoprazole (PROTONIX) 40 MG tablet, Take 40 mg by mouth daily., Disp: , Rfl:    pregabalin (LYRICA) 150 MG capsule, Take 150 mg by mouth 2 (two) times daily., Disp: , Rfl:    rosuvastatin (CRESTOR) 10 MG tablet, Take  10 mg by mouth daily., Disp: , Rfl:    tamsulosin (FLOMAX) 0.4 MG CAPS capsule, Take 0.4 mg by mouth every evening., Disp: , Rfl:    testosterone cypionate (DEPOTESTOSTERONE CYPIONATE) 200 MG/ML injection, Inject 200 mg into the muscle every 28 (twenty-eight) days., Disp: , Rfl:    XARELTO 10 MG TABS tablet, TAKE ONE TABLET BY MOUTH EVERY DAY, Disp: 30 tablet, Rfl: 5  Allergies:  Allergies  Allergen Reactions   Buprenorphine Hcl Rash   Esomeprazole Magnesium Nausea And Vomiting and Other (See Comments)   Morphine Rash   Morphine And Codeine Rash    Past Medical History, Surgical history, Social history, and Family History were reviewed and updated.  Review of Systems: Review of Systems  Constitutional: Negative.   HENT:  Negative.    Eyes: Negative.   Respiratory: Negative.    Cardiovascular:  Positive for leg swelling.  Gastrointestinal: Negative.   Endocrine: Negative.   Genitourinary: Negative.    Musculoskeletal:  Positive for back pain.  Skin: Negative.   Neurological: Negative.   Hematological: Negative.   Psychiatric/Behavioral: Negative.      Physical Exam:  height is 6' (1.829 m) and weight is 260 lb 12.8 oz (118.3 kg). His oral temperature is 97.6 F (36.4 C). His blood pressure is 122/73 and  his pulse is 54 (abnormal). His respiration is 20 and oxygen saturation is 99%.   Wt Readings from Last 3 Encounters:  02/13/23 260 lb 12.8 oz (118.3 kg)  12/26/22 256 lb (116.1 kg)  11/20/22 261 lb (118.4 kg)    Physical Exam Vitals reviewed.  HENT:     Head: Normocephalic and atraumatic.  Eyes:     Pupils: Pupils are equal, round, and reactive to light.  Cardiovascular:     Rate and Rhythm: Normal rate and regular rhythm.     Heart sounds: Normal heart sounds.  Pulmonary:     Effort: Pulmonary effort is normal.     Breath sounds: Normal breath sounds.  Abdominal:     General: Bowel sounds are normal.     Palpations: Abdomen is soft.  Musculoskeletal:         General: No tenderness or deformity. Normal range of motion.     Cervical back: Normal range of motion.     Comments: Back exam shows the laminectomy scars in the lumbar spine.  These are healing.  He is wearing a back brace.  He has some swelling in the left lower leg.  This is more prominent in the upper portion of the left lower leg.  I cannot palpate any obvious venous cord in the leg.  He has good pulses bilaterally.  Lymphadenopathy:     Cervical: No cervical adenopathy.  Skin:    General: Skin is warm and dry.     Findings: No erythema or rash.  Neurological:     Mental Status: He is alert and oriented to person, place, and time.  Psychiatric:        Behavior: Behavior normal.        Thought Content: Thought content normal.        Judgment: Judgment normal.      Lab Results  Component Value Date   WBC 5.8 02/13/2023   HGB 12.3 (L) 02/13/2023   HCT 39.2 02/13/2023   MCV 89.5 02/13/2023   PLT 208 02/13/2023     Chemistry      Component Value Date/Time   NA 140 02/13/2023 1029   K 4.6 02/13/2023 1029   CL 106 02/13/2023 1029   CO2 27 02/13/2023 1029   BUN 25 (H) 02/13/2023 1029   CREATININE 1.19 02/13/2023 1029      Component Value Date/Time   CALCIUM 9.3 02/13/2023 1029   ALKPHOS 88 02/13/2023 1029   AST 23 02/13/2023 1029   ALT 18 02/13/2023 1029   BILITOT 0.9 02/13/2023 1029      Impression and Plan: Tony Frye is a very nice 62 year old white male.  From my perspective, the thromboembolic disease really is not that much of an issue.  He will be on long-term anticoagulation.  Of note, he does have a elevated D-dimer.  This will always be elevated in my opinion.  I do not see a role for studies right now.  Hopefully, his back will be able to be helped.  Again, if he needs to have surgery, I really do not see a problem with from my perspective.  Will plan to get him back in another 3 months.     Josph Macho, MD 9/16/20245:42 AM

## 2023-02-27 ENCOUNTER — Encounter: Payer: Self-pay | Admitting: Internal Medicine

## 2023-02-27 ENCOUNTER — Ambulatory Visit: Payer: Medicare PPO | Attending: Internal Medicine | Admitting: Internal Medicine

## 2023-02-27 VITALS — BP 114/74 | HR 57 | Resp 16 | Ht 74.0 in | Wt 241.0 lb

## 2023-02-27 DIAGNOSIS — Z79899 Other long term (current) drug therapy: Secondary | ICD-10-CM | POA: Diagnosis not present

## 2023-02-27 DIAGNOSIS — M1380 Other specified arthritis, unspecified site: Secondary | ICD-10-CM

## 2023-02-27 DIAGNOSIS — I2699 Other pulmonary embolism without acute cor pulmonale: Secondary | ICD-10-CM | POA: Diagnosis not present

## 2023-02-27 DIAGNOSIS — R760 Raised antibody titer: Secondary | ICD-10-CM

## 2023-02-27 DIAGNOSIS — M159 Polyosteoarthritis, unspecified: Secondary | ICD-10-CM

## 2023-02-27 MED ORDER — HYDROXYCHLOROQUINE SULFATE 200 MG PO TABS
400.0000 mg | ORAL_TABLET | Freq: Every day | ORAL | 1 refills | Status: DC
Start: 2023-03-26 — End: 2023-09-23

## 2023-02-27 NOTE — Patient Instructions (Signed)
You can try splinting the trigger finger overnight to see if this decreases symptoms in the morning or throughout the day. If it continues to be very bothersome we could follow up as needed to treat with a steroid injection.  I am rechecking your abnormal lab results from July now that you are on the hydroxychloroquine I would expect to see improvement.

## 2023-02-28 NOTE — Progress Notes (Signed)
Sedimentation rate improved to 11 down from 25.  This fits with the reported improvement in joint inflammation symptoms.  Agree with continuing the current hydroxychloroquine.

## 2023-03-01 LAB — C-REACTIVE PROTEIN: CRP: <3 mg/L (ref <8.0)

## 2023-03-01 LAB — PHOSPHATIDYLSERINE/PROTHROMBIN (PS/PT) ANTIBODIES (IGG, IGM)
Phosphatidylserine/Prothrombin Ab (IgG): 23 U (ref <=30)
Phosphatidylserine/Prothrombin Ab (IgM): <9 U (ref <=30)

## 2023-03-01 LAB — SEDIMENTATION RATE: Sed Rate: 11 mm/h (ref 0–20)

## 2023-03-18 ENCOUNTER — Other Ambulatory Visit: Payer: Self-pay | Admitting: Hematology & Oncology

## 2023-04-01 DIAGNOSIS — K047 Periapical abscess without sinus: Secondary | ICD-10-CM | POA: Diagnosis not present

## 2023-04-01 DIAGNOSIS — Z880 Allergy status to penicillin: Secondary | ICD-10-CM | POA: Diagnosis not present

## 2023-04-01 DIAGNOSIS — Z87891 Personal history of nicotine dependence: Secondary | ICD-10-CM | POA: Diagnosis not present

## 2023-04-01 DIAGNOSIS — Z888 Allergy status to other drugs, medicaments and biological substances status: Secondary | ICD-10-CM | POA: Diagnosis not present

## 2023-04-01 DIAGNOSIS — Z885 Allergy status to narcotic agent status: Secondary | ICD-10-CM | POA: Diagnosis not present

## 2023-05-15 ENCOUNTER — Other Ambulatory Visit: Payer: Medicare PPO

## 2023-05-15 ENCOUNTER — Ambulatory Visit: Payer: Medicare PPO | Admitting: Hematology & Oncology

## 2023-05-22 DIAGNOSIS — M25551 Pain in right hip: Secondary | ICD-10-CM | POA: Diagnosis not present

## 2023-05-22 DIAGNOSIS — G8929 Other chronic pain: Secondary | ICD-10-CM | POA: Diagnosis not present

## 2023-05-22 DIAGNOSIS — M545 Low back pain, unspecified: Secondary | ICD-10-CM | POA: Diagnosis not present

## 2023-05-22 DIAGNOSIS — M461 Sacroiliitis, not elsewhere classified: Secondary | ICD-10-CM | POA: Diagnosis not present

## 2023-05-22 DIAGNOSIS — M1611 Unilateral primary osteoarthritis, right hip: Secondary | ICD-10-CM | POA: Diagnosis not present

## 2023-05-23 DIAGNOSIS — D6869 Other thrombophilia: Secondary | ICD-10-CM | POA: Diagnosis not present

## 2023-05-23 DIAGNOSIS — I10 Essential (primary) hypertension: Secondary | ICD-10-CM | POA: Diagnosis not present

## 2023-05-23 DIAGNOSIS — E785 Hyperlipidemia, unspecified: Secondary | ICD-10-CM | POA: Diagnosis not present

## 2023-05-23 DIAGNOSIS — I700 Atherosclerosis of aorta: Secondary | ICD-10-CM | POA: Diagnosis not present

## 2023-05-23 DIAGNOSIS — N401 Enlarged prostate with lower urinary tract symptoms: Secondary | ICD-10-CM | POA: Diagnosis not present

## 2023-05-23 DIAGNOSIS — F3342 Major depressive disorder, recurrent, in full remission: Secondary | ICD-10-CM | POA: Diagnosis not present

## 2023-05-23 DIAGNOSIS — Z86711 Personal history of pulmonary embolism: Secondary | ICD-10-CM | POA: Diagnosis not present

## 2023-05-23 DIAGNOSIS — Z0000 Encounter for general adult medical examination without abnormal findings: Secondary | ICD-10-CM | POA: Diagnosis not present

## 2023-05-23 DIAGNOSIS — Z23 Encounter for immunization: Secondary | ICD-10-CM | POA: Diagnosis not present

## 2023-06-04 DIAGNOSIS — M5416 Radiculopathy, lumbar region: Secondary | ICD-10-CM | POA: Diagnosis not present

## 2023-06-10 ENCOUNTER — Other Ambulatory Visit: Payer: Self-pay

## 2023-06-10 ENCOUNTER — Encounter: Payer: Self-pay | Admitting: Family

## 2023-06-10 ENCOUNTER — Inpatient Hospital Stay (HOSPITAL_BASED_OUTPATIENT_CLINIC_OR_DEPARTMENT_OTHER): Payer: Medicare PPO | Admitting: Family

## 2023-06-10 ENCOUNTER — Inpatient Hospital Stay: Payer: Medicare PPO | Attending: Hematology & Oncology

## 2023-06-10 VITALS — BP 132/55 | HR 62 | Temp 98.0°F | Resp 19 | Wt 264.8 lb

## 2023-06-10 DIAGNOSIS — I2609 Other pulmonary embolism with acute cor pulmonale: Secondary | ICD-10-CM

## 2023-06-10 DIAGNOSIS — D6859 Other primary thrombophilia: Secondary | ICD-10-CM | POA: Diagnosis not present

## 2023-06-10 DIAGNOSIS — Z86711 Personal history of pulmonary embolism: Secondary | ICD-10-CM | POA: Insufficient documentation

## 2023-06-10 DIAGNOSIS — I824Y9 Acute embolism and thrombosis of unspecified deep veins of unspecified proximal lower extremity: Secondary | ICD-10-CM | POA: Diagnosis not present

## 2023-06-10 DIAGNOSIS — Z7901 Long term (current) use of anticoagulants: Secondary | ICD-10-CM | POA: Insufficient documentation

## 2023-06-10 DIAGNOSIS — I2782 Chronic pulmonary embolism: Secondary | ICD-10-CM | POA: Diagnosis not present

## 2023-06-10 DIAGNOSIS — I2690 Septic pulmonary embolism without acute cor pulmonale: Secondary | ICD-10-CM | POA: Diagnosis not present

## 2023-06-10 DIAGNOSIS — D6862 Lupus anticoagulant syndrome: Secondary | ICD-10-CM | POA: Diagnosis not present

## 2023-06-10 LAB — D-DIMER, QUANTITATIVE: D-Dimer, Quant: 2.4 ug{FEU}/mL — ABNORMAL HIGH (ref 0.00–0.50)

## 2023-06-10 LAB — CMP (CANCER CENTER ONLY)
ALT: 23 U/L (ref 0–44)
AST: 26 U/L (ref 15–41)
Albumin: 4.5 g/dL (ref 3.5–5.0)
Alkaline Phosphatase: 91 U/L (ref 38–126)
Anion gap: 10 (ref 5–15)
BUN: 22 mg/dL (ref 8–23)
CO2: 28 mmol/L (ref 22–32)
Calcium: 10 mg/dL (ref 8.9–10.3)
Chloride: 106 mmol/L (ref 98–111)
Creatinine: 1.17 mg/dL (ref 0.61–1.24)
GFR, Estimated: >60 mL/min (ref >60)
Glucose, Bld: 113 mg/dL — ABNORMAL HIGH (ref 70–99)
Potassium: 4 mmol/L (ref 3.5–5.1)
Sodium: 144 mmol/L (ref 135–145)
Total Bilirubin: 0.8 mg/dL (ref 0.0–1.2)
Total Protein: 6.9 g/dL (ref 6.5–8.1)

## 2023-06-10 LAB — CBC WITH DIFFERENTIAL (CANCER CENTER ONLY)
Abs Immature Granulocytes: 0.02 10*3/uL (ref 0.00–0.07)
Basophils Absolute: 0.1 10*3/uL (ref 0.0–0.1)
Basophils Relative: 1 %
Eosinophils Absolute: 0.2 10*3/uL (ref 0.0–0.5)
Eosinophils Relative: 5 %
HCT: 37.4 % — ABNORMAL LOW (ref 39.0–52.0)
Hemoglobin: 12.2 g/dL — ABNORMAL LOW (ref 13.0–17.0)
Immature Granulocytes: 1 %
Lymphocytes Relative: 23 %
Lymphs Abs: 1 10*3/uL (ref 0.7–4.0)
MCH: 28.8 pg (ref 26.0–34.0)
MCHC: 32.6 g/dL (ref 30.0–36.0)
MCV: 88.4 fL (ref 80.0–100.0)
Monocytes Absolute: 0.4 10*3/uL (ref 0.1–1.0)
Monocytes Relative: 9 %
Neutro Abs: 2.6 10*3/uL (ref 1.7–7.7)
Neutrophils Relative %: 61 %
Platelet Count: 185 10*3/uL (ref 150–400)
RBC: 4.23 MIL/uL (ref 4.22–5.81)
RDW: 15 % (ref 11.5–15.5)
WBC Count: 4.2 10*3/uL (ref 4.0–10.5)
nRBC: 0 % (ref 0.0–0.2)

## 2023-06-10 LAB — LACTATE DEHYDROGENASE: LDH: 221 U/L — ABNORMAL HIGH (ref 98–192)

## 2023-06-10 NOTE — Progress Notes (Signed)
Hematology and Oncology Follow Up Visit  UMAIR GRADDICK [id number] [birth year] 62 y.o. 06/10/2023   Principle Diagnosis:  History of pulmonary embolism-post left knee surgery-01/2020 -- transition positive lupus anticoagulant   Current Therapy:        Xarelto 10 mg p.o. daily   Interim History:  Mr. Kegel is here today for follow-up. He is doing well but has had issues with arthritis effecting his lower extremities, shoulders and back.  He states that he does fall occasionally due to leg stiffness. He is taking a muscle relaxer as needed. Thankfully he has not been seriously injured.  He is taking his maintenance Xarelto as prescribed. No blood loss, abnormal bruising or petechiae noted.  No fever, chills, n/v, cough, rash, dizziness, SOB, chest pain, palpitations, abdominal pain or changes in bowel or bladder habits.  Neuropathy in his feet unchanged.  Appetite and hydration are good. Weight is stable at 264 lbs.   ECOG Performance Status: 1 - Symptomatic but completely ambulatory  Medications:  Allergies as of 06/10/2023       Reactions   Buprenorphine Hcl Rash   Esomeprazole Magnesium Nausea And Vomiting, Other (See Comments)   Morphine Rash   Morphine And Codeine Rash        Medication List        Accurate as of June 10, 2023 11:07 AM. If you have any questions, ask your nurse or doctor.          buPROPion 300 MG 24 hr tablet Commonly known as: WELLBUTRIN XL Take 300 mg by mouth every morning.   celecoxib 200 MG capsule Commonly known as: CELEBREX TAKE ONE CAPSULE BY MOUTH TWICE DAILY   hydroxychloroquine 200 MG tablet Commonly known as: PLAQUENIL Take 2 tablets (400 mg total) by mouth daily.   levocetirizine 5 MG tablet Commonly known as: XYZAL Take 5 mg by mouth every evening.   lisinopril 10 MG tablet Commonly known as: ZESTRIL Take 10 mg by mouth daily.   multivitamin with minerals Tabs tablet Take 1 tablet by mouth daily.   OVER THE  COUNTER MEDICATION daily. CLA Supplement   pantoprazole 40 MG tablet Commonly known as: PROTONIX Take 40 mg by mouth daily.   pregabalin 150 MG capsule Commonly known as: LYRICA Take 150 mg by mouth 2 (two) times daily.   rosuvastatin 10 MG tablet Commonly known as: CRESTOR Take 10 mg by mouth daily.   tamsulosin 0.4 MG Caps capsule Commonly known as: FLOMAX Take 0.4 mg by mouth every evening.   testosterone cypionate 200 MG/ML injection Commonly known as: DEPOTESTOSTERONE CYPIONATE Inject 200 mg into the muscle every 28 (twenty-eight) days.   Xarelto 10 MG Tabs tablet Generic drug: rivaroxaban TAKE ONE TABLET BY MOUTH EVERY DAY        Allergies:  Allergies  Allergen Reactions   Buprenorphine Hcl Rash   Esomeprazole Magnesium Nausea And Vomiting and Other (See Comments)   Morphine Rash   Morphine And Codeine Rash    Past Medical History, Surgical history, Social history, and Family History were reviewed and updated.  Review of Systems: All other 10 point review of systems is negative.   Physical Exam:  weight is 264 lb 12.8 oz (120.1 kg). His oral temperature is 98 F (36.7 C). His blood pressure is 132/55 (abnormal) and his pulse is 62. His respiration is 19 and oxygen saturation is 100%.   Wt Readings from Last 3 Encounters:  06/10/23 264 lb 12.8 oz (120.1 kg)  02/27/23 241  lb (109.3 kg)  02/13/23 260 lb 12.8 oz (118.3 kg)    Ocular: Sclerae unicteric, pupils equal, round and reactive to light Ear-nose-throat: Oropharynx clear, dentition fair Lymphatic: No cervical or supraclavicular adenopathy Lungs no rales or rhonchi, good excursion bilaterally Heart regular rate and rhythm, no murmur appreciated Abd soft, nontender, positive bowel sounds MSK no focal spinal tenderness, no joint edema Neuro: non-focal, well-oriented, appropriate affect Breasts: Deferred   Lab Results  Component Value Date   WBC 4.2 06/10/2023   HGB 12.2 (L) 06/10/2023   HCT  37.4 (L) 06/10/2023   MCV 88.4 06/10/2023   PLT 185 06/10/2023   No results found for: "FERRITIN", "IRON", "TIBC", "UIBC", "IRONPCTSAT" Lab Results  Component Value Date   RBC 4.23 06/10/2023   No results found for: "KPAFRELGTCHN", "LAMBDASER", "KAPLAMBRATIO" No results found for: "IGGSERUM", "IGA", "IGMSERUM" No results found for: "TOTALPROTELP", "ALBUMINELP", "A1GS", "A2GS", "BETS", "BETA2SER", "GAMS", "MSPIKE", "SPEI"   Chemistry      Component Value Date/Time   NA 144 06/10/2023 1023   K 4.0 06/10/2023 1023   CL 106 06/10/2023 1023   CO2 28 06/10/2023 1023   BUN 22 06/10/2023 1023   CREATININE 1.17 06/10/2023 1023      Component Value Date/Time   CALCIUM 10.0 06/10/2023 1023   ALKPHOS 91 06/10/2023 1023   AST 26 06/10/2023 1023   ALT 23 06/10/2023 1023   BILITOT 0.8 06/10/2023 1023       Impression and Plan: Mr. Altizer is a very pleasant 62 yo caucasian gentleman with thromboembolic disease on maintenance Xarelto lifelong.  D-dimer remains elevated. He has also been positive for the lupus anticoagulant.  He is tolerating nicely and taking as prescribed. No change indicated at this time.  Follow-up with lab in 6 months.   Eileen Stanford, NP 12/30/202411:07 AM

## 2023-06-11 LAB — LUPUS ANTICOAGULANT PANEL
DRVVT: 37.9 s (ref 0.0–47.0)
PTT Lupus Anticoagulant: 35.3 s (ref 0.0–43.5)

## 2023-06-11 LAB — CARDIOLIPIN ANTIBODIES, IGG, IGM, IGA
Anticardiolipin IgA: <9 U/mL (ref 0–11)
Anticardiolipin IgG: <9 GPL U/mL (ref 0–14)
Anticardiolipin IgM: <9 [MPL'U]/mL (ref 0–12)

## 2023-06-19 ENCOUNTER — Other Ambulatory Visit: Payer: Self-pay | Admitting: Internal Medicine

## 2023-06-19 DIAGNOSIS — R760 Raised antibody titer: Secondary | ICD-10-CM

## 2023-06-19 DIAGNOSIS — M1380 Other specified arthritis, unspecified site: Secondary | ICD-10-CM

## 2023-06-24 ENCOUNTER — Encounter: Payer: Self-pay | Admitting: Family

## 2023-06-26 DIAGNOSIS — G4733 Obstructive sleep apnea (adult) (pediatric): Secondary | ICD-10-CM | POA: Diagnosis not present

## 2023-07-10 DIAGNOSIS — G4733 Obstructive sleep apnea (adult) (pediatric): Secondary | ICD-10-CM | POA: Diagnosis not present

## 2023-07-22 ENCOUNTER — Other Ambulatory Visit: Payer: Self-pay | Admitting: Hematology & Oncology

## 2023-07-23 DIAGNOSIS — G4733 Obstructive sleep apnea (adult) (pediatric): Secondary | ICD-10-CM | POA: Diagnosis not present

## 2023-08-13 NOTE — Progress Notes (Deleted)
 Office Visit Note  Patient: Tony Frye             Date of Birth: 1961/01/31           MRN: [id number]             PCP: Joycelyn Rua, MD Referring: Joycelyn Rua, MD Visit Date: 08/27/2023   Subjective:  No chief complaint on file.   History of Present Illness: Tony Frye is a 63 y.o. male here for follow up for chronic joint pain and fatigue associated with persistent serum inflammatory markers and lupus anticoagulant disorder.    Previous HPI 02/27/2023 Tony Frye is a 63 y.o. male here for follow up for chronic joint pain and fatigue associated with persistent serum inflammatory markers and lupus anticoagulant disorder.  Since starting hydroxychloroquine he noticed a significant improvement in joint pain and stiffness although he still has quite a bit in his back first thing in the morning and especially when sitting for prolonged time.  Bilateral trigger fingers worst at right fourth digit has got little more bothersome especially gets stuck and has trouble with that first thing in the morning.  He had bilateral carpal tunnel release surgery with Dr. Orlan Leavens which went well but has been slow to fully heal still gets soreness with pressure on his hands.  He had diarrhea after starting hydroxychloroquine so interrupted his treatment for about 2 weeks and noticed worsening symptoms at that time but has been tolerating it well since resuming.   Previous HPI 12/26/2022 Tony Frye is a 63 y.o. male here for follow up for chronic joint pain and fatigue associated with persistent serum inflammatory markers and lupus anticoagulant disorder.  Exam at our initial visit was consistent with generalized osteoarthritis changes laboratory testing did show mild elevation in sedimentation rate at 25 borderline positive ANA 1: 40 and phosphatidylserine IgG of 35.  Symptoms remain about the same having issues with pain and stiffness in multiple areas especially after long distance  driving or otherwise spending a long time in a seated position.  Also describes just a very generalized fatigue with low exertion tolerance and energy level.  Not specifically excessive daytime sleepiness.   Previous HPI 11/20/22 Tony Frye is a 63 y.o. male here for evaluation of chronic joint pain affecting multiple areas with history of blood clot with positive lupus anticoagulant and persistent elevated D-dimer.  He was referred by Dr. Myna Hidalgo who has been seeing him for his anticoagulation on Xarelto 10 mg daily he has been on this continuously after pulmonary embolism in 2021 provoked following left knee surgery.  He did not have any previous diagnosis of lupus associated with this.  He has degenerative arthritis of multiple areas.  A lot of problems from his chronic back pain has known degenerative disc disease and had previous lumbar laminectomy from L4-S1 and subsequently developing more problems at the L3-L4 level.  Also had bilateral total knee replacements for osteoarthritis.  Left knee has required 1 revision surgery.  Previous history of rotator cuff tear he is planned for left shoulder surgery for this.  Also had right shoulder steroid injection about 6 weeks ago that is partially helpful.  He takes Celebrex and Lyrica for joint pain and stiffness knees improve his symptoms partially.  He takes tramadol intermittently this is beneficial when he takes it.   No Rheumatology ROS completed.   PMFS History:  Patient Active Problem List   Diagnosis Date Noted   Seronegative  inflammatory arthritis 12/26/2022   Antiphospholipid antibody positive 11/20/2022   Generalized osteoarthritis of multiple sites 11/20/2022   Myofascial pain 11/20/2022   Intractable pain 04/03/2022   Pulmonary embolism (HCC) 06/19/2021   Radiculopathy 04/26/2021   Chronic back pain 04/18/2021   Leukocytosis 01/23/2020   Persistent cough 01/23/2020   Pulmonary embolism and infarction (HCC) 01/23/2020   S/P  revision of total knee, left 01/15/2020   Mechanical loosening of internal left knee prosthetic joint (HCC) 01/12/2020   Failed total left knee replacement (HCC) 12/29/2019   Bilateral carpal tunnel syndrome 11/09/2015   Primary osteoarthritis of both first carpometacarpal joints 11/09/2015   Alcohol use disorder, moderate, dependence (HCC) 07/26/2015   Mild episode of recurrent major depressive disorder (HCC) 07/26/2015    Past Medical History:  Diagnosis Date   Cancer (HCC)    squamous cell, face   Depressed    Hiatal hernia    Hypertension    Low testosterone    OSA on CPAP    Osteoarthritis    both knees   Pulmonary embolism (HCC) 06/19/2021   Reflux esophagitis     Family History  Problem Relation Age of Onset   Cancer - Colon Mother    Cancer - Other Mother    Heart disease Father    Hypertension Maternal Grandfather    Past Surgical History:  Procedure Laterality Date   ABDOMINAL EXPOSURE N/A 04/26/2021   Procedure: ABDOMINAL EXPOSURE;  Surgeon: Cephus Shelling, MD;  Location: [facility] OR;  Service: Vascular;  Laterality: N/A;   ANTERIOR LUMBAR FUSION N/A 04/26/2021   Procedure: ANTERIOR LUMBAR INTERBODY FUSION LUMBAR 4- LUMBAR 5, LUMBAR 5- SACRUM 1 WITH INSTRUMENTATION AND ALLOGRAFT;  Surgeon: Estill Bamberg, MD;  Location: [facility] OR;  Service: Orthopedics;  Laterality: N/A;   BACK SURGERY     JOINT REPLACEMENT     KNEE ARTHROSCOPY Right    LUMBAR LAMINECTOMY  2022   partial ampu     REPLACEMENT TOTAL KNEE Left 2008   revision, August 2021   REPLACEMENT TOTAL KNEE Right 2010   SQUAMOUS CELL CARCINOMA EXCISION     Social History   Social History Narrative   Not on file   Immunization History  Administered Date(s) Administered   Influenza, Seasonal, Injecte, Preservative Fre 02/13/2018   Influenza,inj,Quad PF,6+ Mos 03/27/2019   Influenza,inj,Quad PF,6-35 Mos 03/27/2019   PFIZER(Purple Top)SARS-COV-2 Vaccination 08/27/2019, 09/17/2019, 03/29/2020      Objective: Vital Signs: There were no vitals taken for this visit.   Physical Exam   Musculoskeletal Exam: ***  CDAI Exam: CDAI Score: -- Patient Global: --; Provider Global: -- Swollen: --; Tender: -- Joint Exam 08/27/2023   No joint exam has been documented for this visit   There is currently no information documented on the homunculus. Go to the Rheumatology activity and complete the homunculus joint exam.  Investigation: No additional findings.  Imaging: No results found.  Recent Labs: Lab Results  Component Value Date   WBC 4.2 06/10/2023   HGB 12.2 (L) 06/10/2023   PLT 185 06/10/2023   NA 144 06/10/2023   K 4.0 06/10/2023   CL 106 06/10/2023   CO2 28 06/10/2023   GLUCOSE 113 (H) 06/10/2023   BUN 22 06/10/2023   CREATININE 1.17 06/10/2023   BILITOT 0.8 06/10/2023   ALKPHOS 91 06/10/2023   AST 26 06/10/2023   ALT 23 06/10/2023   PROT 6.9 06/10/2023   ALBUMIN 4.5 06/10/2023   CALCIUM 10.0 06/10/2023   GFRAA  04/06/2010    >  60        The eGFR has been calculated using the MDRD equation. This calculation has not been validated in all clinical situations. eGFR's persistently <60 mL/min signify possible Chronic Kidney Disease.    Speciality Comments: No specialty comments available.  Procedures:  No procedures performed Allergies: Buprenorphine hcl, Esomeprazole magnesium, Morphine, and Morphine and codeine   Assessment / Plan:     Visit Diagnoses: No diagnosis found.  ***  Orders: No orders of the defined types were placed in this encounter.  No orders of the defined types were placed in this encounter.    Follow-Up Instructions: No follow-ups on file.   Metta Clines, RT  Note - This record has been created using [employer].  Chart creation errors have been sought, but may not always  have been located. Such creation errors do not reflect on  the standard of medical care.

## 2023-08-20 DIAGNOSIS — G4733 Obstructive sleep apnea (adult) (pediatric): Secondary | ICD-10-CM | POA: Diagnosis not present

## 2023-08-22 DIAGNOSIS — M069 Rheumatoid arthritis, unspecified: Secondary | ICD-10-CM | POA: Diagnosis not present

## 2023-08-27 ENCOUNTER — Ambulatory Visit: Payer: Medicare PPO | Admitting: Internal Medicine

## 2023-08-27 DIAGNOSIS — I2699 Other pulmonary embolism without acute cor pulmonale: Secondary | ICD-10-CM

## 2023-08-27 DIAGNOSIS — M1380 Other specified arthritis, unspecified site: Secondary | ICD-10-CM

## 2023-08-27 DIAGNOSIS — Z79899 Other long term (current) drug therapy: Secondary | ICD-10-CM

## 2023-08-27 DIAGNOSIS — M159 Polyosteoarthritis, unspecified: Secondary | ICD-10-CM

## 2023-08-27 DIAGNOSIS — R760 Raised antibody titer: Secondary | ICD-10-CM

## 2023-09-09 NOTE — Progress Notes (Signed)
 Office Visit Note  Patient: Tony Frye             Date of Birth: Jan 03, 1961           MRN: [id number]             PCP: Wyn Heater, MD Referring: Wyn Heater, MD Visit Date: 09/23/2023   Subjective:  Follow-up (Patient states he needs a refill of the Hydroxychloroquine . )  Discussed the use of AI scribe software for clinical note transcription with the patient, who gave verbal consent to proceed.  History of Present Illness   Tony Frye is a 63 y.o. male here for follow up for inflammatory arthritis and lupus anticoagulant disorder on HCQ 400 mg daily.    He engages in workout classes three to five times a week, including weight lifting, which has improved his overall physical function, allowing him to perform daily activities more easily. Despite these improvements, he notes persistent knee pain, particularly after exercise, and ongoing back issues. He avoids high-impact exercises and modifies activities to accommodate his knee and back pain.  He experiences persistent knee pain, especially after exercise. Weight loss and body composition changes are ongoing, which may help reduce knee stress.  He continues to experience back pain, noting that he had a spinal fusion of the L4-L5 vertebrae, but the adjacent L3 vertebra has since deteriorated. He attributes this to his active lifestyle, including remodeling and gardening activities.  He has a history of a full tear in his shoulder, which he manages by avoiding certain exercises like bench pressing.  He describes issues with his hands, including arthritis and trigger fingers, particularly in his thumbs, which sometimes get stuck. He has had carpal tunnel surgery on one hand, which alleviated some numbness, but he has not yet had surgery on his dominant hand.  He reports concerns about varicose veins and venous stasis dermatitis in his legs, noting darkening of the skin and visible veins. He has a history of blood  clots, which can contribute to these symptoms.  He is currently taking Celebrex  twice daily, which he finds more effective than diclofenac , especially since diclofenac  interfered with his Xarelto . He also takes hydroxychloroquine , which he finds effective for managing his lupus anticoagulant condition. His D-dimer levels have remained elevated since a past blood clot, but he has not experienced any new clotting events.   Previous HPI 02/27/2023 Tony Frye is a 63 y.o. male here for follow up for chronic joint pain and fatigue associated with persistent serum inflammatory markers and lupus anticoagulant disorder.  Since starting hydroxychloroquine  he noticed a significant improvement in joint pain and stiffness although he still has quite a bit in his back first thing in the morning and especially when sitting for prolonged time.  Bilateral trigger fingers worst at right fourth digit has got little more bothersome especially gets stuck and has trouble with that first thing in the morning.  He had bilateral carpal tunnel release surgery with Dr. Primus Brookes which went well but has been slow to fully heal still gets soreness with pressure on his hands.  He had diarrhea after starting hydroxychloroquine  so interrupted his treatment for about 2 weeks and noticed worsening symptoms at that time but has been tolerating it well since resuming.   Previous HPI 12/26/2022 Tony Frye is a 63 y.o. male here for follow up for chronic joint pain and fatigue associated with persistent serum inflammatory markers and lupus anticoagulant disorder.  Exam at our initial  visit was consistent with generalized osteoarthritis changes laboratory testing did show mild elevation in sedimentation rate at 25 borderline positive ANA 1: 40 and phosphatidylserine IgG of 35.  Symptoms remain about the same having issues with pain and stiffness in multiple areas especially after long distance driving or otherwise spending a long time  in a seated position.  Also describes just a very generalized fatigue with low exertion tolerance and energy level.  Not specifically excessive daytime sleepiness.   Previous HPI 11/20/22 Tony Frye is a 63 y.o. male here for evaluation of chronic joint pain affecting multiple areas with history of blood clot with positive lupus anticoagulant and persistent elevated D-dimer.  He was referred by Dr. Maria Shiner who has been seeing him for his anticoagulation on Xarelto  10 mg daily he has been on this continuously after pulmonary embolism in 2021 provoked following left knee surgery.  He did not have any previous diagnosis of lupus associated with this.  He has degenerative arthritis of multiple areas.  A lot of problems from his chronic back pain has known degenerative disc disease and had previous lumbar laminectomy from L4-S1 and subsequently developing more problems at the L3-L4 level.  Also had bilateral total knee replacements for osteoarthritis.  Left knee has required 1 revision surgery.  Previous history of rotator cuff tear he is planned for left shoulder surgery for this.  Also had right shoulder steroid injection about 6 weeks ago that is partially helpful.  He takes Celebrex  and Lyrica  for joint pain and stiffness knees improve his symptoms partially.  He takes tramadol  intermittently this is beneficial when he takes it.   Review of Systems  Constitutional:  Positive for fatigue.  HENT:  Positive for mouth sores. Negative for mouth dryness.   Eyes:  Negative for dryness.  Respiratory:  Negative for shortness of breath.   Cardiovascular:  Negative for chest pain and palpitations.  Gastrointestinal:  Negative for blood in stool, constipation and diarrhea.  Endocrine: Positive for increased urination.  Genitourinary:  Positive for involuntary urination.  Musculoskeletal:  Positive for joint pain, joint pain, joint swelling, myalgias, muscle weakness, morning stiffness, muscle tenderness and  myalgias. Negative for gait problem.  Skin:  Negative for color change, rash, hair loss and sensitivity to sunlight.  Allergic/Immunologic: Negative for susceptible to infections.  Neurological:  Negative for dizziness and headaches.  Hematological:  Negative for swollen glands.  Psychiatric/Behavioral:  Negative for depressed mood and sleep disturbance. The patient is not nervous/anxious.     PMFS History:  Patient Active Problem List   Diagnosis Date Noted   Seronegative inflammatory arthritis 12/26/2022   Antiphospholipid antibody positive 11/20/2022   Generalized osteoarthritis of multiple sites 11/20/2022   Myofascial pain 11/20/2022   Intractable pain 04/03/2022   Pulmonary embolism (HCC) 06/19/2021   Radiculopathy 04/26/2021   Chronic back pain 04/18/2021   Leukocytosis 01/23/2020   Persistent cough 01/23/2020   Pulmonary embolism and infarction (HCC) 01/23/2020   S/P revision of total knee, left 01/15/2020   Mechanical loosening of internal left knee prosthetic joint (HCC) 01/12/2020   Failed total left knee replacement (HCC) 12/29/2019   Bilateral carpal tunnel syndrome 11/09/2015   Primary osteoarthritis of both first carpometacarpal joints 11/09/2015   Alcohol use disorder, moderate, dependence (HCC) 07/26/2015   Mild episode of recurrent major depressive disorder (HCC) 07/26/2015    Past Medical History:  Diagnosis Date   Cancer (HCC)    squamous cell, face   Depressed    Hiatal hernia  Hypertension    Low testosterone     OSA on CPAP    Osteoarthritis    both knees   Pulmonary embolism (HCC) 06/19/2021   Reflux esophagitis     Family History  Problem Relation Age of Onset   Cancer - Colon Mother    Cancer - Other Mother    Heart disease Father    Hypertension Maternal Grandfather    Past Surgical History:  Procedure Laterality Date   ABDOMINAL EXPOSURE N/A 04/26/2021   Procedure: ABDOMINAL EXPOSURE;  Surgeon: Young Hensen, MD;  Location: [facility]  OR;  Service: Vascular;  Laterality: N/A;   ANTERIOR LUMBAR FUSION N/A 04/26/2021   Procedure: ANTERIOR LUMBAR INTERBODY FUSION LUMBAR 4- LUMBAR 5, LUMBAR 5- SACRUM 1 WITH INSTRUMENTATION AND ALLOGRAFT;  Surgeon: Virl Grimes, MD;  Location: [facility] OR;  Service: Orthopedics;  Laterality: N/A;   BACK SURGERY     JOINT REPLACEMENT     KNEE ARTHROSCOPY Right    LUMBAR LAMINECTOMY  2022   partial ampu     REPLACEMENT TOTAL KNEE Left 2008   revision, August 2021   REPLACEMENT TOTAL KNEE Right 2010   SQUAMOUS CELL CARCINOMA EXCISION     Social History   Social History Narrative   Not on file   Immunization History  Administered Date(s) Administered   Influenza, Seasonal, Injecte, Preservative Fre 02/13/2018   Influenza,inj,Quad PF,6+ Mos 03/27/2019   Influenza,inj,Quad PF,6-35 Mos 03/27/2019   PFIZER(Purple Top)SARS-COV-2 Vaccination 08/27/2019, 09/17/2019, 03/29/2020     Objective: Vital Signs: BP 108/60 (BP Location: Left Arm, Patient Position: Sitting, Cuff Size: Large)   Pulse 77   Resp 14   Ht 6\' 2"  (1.88 m)   Wt 259 lb (117.5 kg)   BMI 33.25 kg/m    Physical Exam HENT:     Ears:     Comments: Irregular, thickened small nodule of skin on right helix Eyes:     Conjunctiva/sclera: Conjunctivae normal.  Cardiovascular:     Rate and Rhythm: Normal rate and regular rhythm.  Pulmonary:     Effort: Pulmonary effort is normal.     Breath sounds: Normal breath sounds.  Lymphadenopathy:     Cervical: No cervical adenopathy.  Skin:    General: Skin is warm and dry.     Comments: Tortuous lower extremity veins, mild overlying hyperpigmentation No lesions, no pitting edema  Neurological:     Mental Status: He is alert.  Psychiatric:        Mood and Affect: Mood normal.      Musculoskeletal Exam:  Shoulders full ROM right shoulder slow/guarding with active ROM, no focal tenderness to pressure Elbows full ROM no tenderness or swelling Wrists full ROM no tenderness or  swelling Fingers full ROM chronic bony widening MCPs and DIP heberdon's nodes, triggering right 4th digit and tenderness on flexor tendon Knees full ROM chronic postsurgical changes   Investigation: No additional findings.  Imaging: No results found.  Recent Labs: Lab Results  Component Value Date   WBC 8.4 09/23/2023   HGB 12.8 (L) 09/23/2023   PLT 231 09/23/2023   NA 140 09/23/2023   K 4.7 09/23/2023   CL 105 09/23/2023   CO2 28 09/23/2023   GLUCOSE 93 09/23/2023   BUN 18 09/23/2023   CREATININE 1.11 09/23/2023   BILITOT 1.1 09/23/2023   ALKPHOS 91 06/10/2023   AST 26 09/23/2023   ALT 24 09/23/2023   PROT 6.8 09/23/2023   ALBUMIN 4.5 06/10/2023   CALCIUM  9.7 09/23/2023  GFRAA  04/06/2010    >60        The eGFR has been calculated using the MDRD equation. This calculation has not been validated in all clinical situations. eGFR's persistently <60 mL/min signify possible Chronic Kidney Disease.    Speciality Comments: PLQ Eye Exam: 08/23/2023 Normal @[facility]  [place] f/u 1 year  Procedures:  No procedures performed Allergies: Buprenorphine hcl, Esomeprazole magnesium, Morphine, and Morphine and codeine   Assessment / Plan:     Visit Diagnoses: Seronegative inflammatory arthritis Rheumatoid arthritis managed with hydroxychloroquine . Trigger finger and hand numbness likely due to arthritis and carpal tunnel syndrome. - Continue HCQ 400 mg daily - Checking sed rate for monitoring activity - Consider corticosteroid injections for trigger finger if symptoms worsen.  High risk medication use - hydroxychloroquine  at 400 mg daily, Celebrex  200 mg twice daily. PLQ Eye Exam: 08/23/2023 Normal. - Checking CBC and CMP for medication monitoring on maintenance HCQ  Antiphospholipid antibody positive - now on Xarelto  treatment Lupus anticoagulant syndrome Lupus anticoagulant syndrome managed with hydroxychloroquine  and Xarelto . Elevated D-dimer due to chronic  inflammation, no specific evidence of chronic thombus burden. - Continue hydroxychloroquine  and Xarelto  as prescribed.  Knee pain Knee pain exacerbated by weight and exercise routine. - Continue to avoid high-impact exercises and activities that exacerbate knee pain.  Shoulder tear Full shoulder tear managed by avoiding aggravating exercises. - Continue to avoid exercises that may exacerbate shoulder injury.  Venous stasis dermatitis Venous stasis dermatitis due to previous blood clots and venous insufficiency. - Provide information on venous stasis dermatitis. - Consider referral to a vein specialist if symptoms worsen.  Obesity Active weight loss efforts with improved body composition and physical function. - Continue current exercise regimen and dietary modifications.  Actinic keratosis Scabbing lesion on ear likely actinic keratosis.   Orders: Orders Placed This Encounter  Procedures   Sedimentation rate   CBC with Differential/Platelet   Comprehensive metabolic panel with GFR   Meds ordered this encounter  Medications   hydroxychloroquine  (PLAQUENIL ) 200 MG tablet    Sig: Take 2 tablets (400 mg total) by mouth daily.    Dispense:  180 tablet    Refill:  1     Follow-Up Instructions: Return in about 6 months (around 03/24/2024) for OA/APS/RA on HCQ/COX2 f/u 6mos.   Matt Song, MD  Note - This record has been created using [employer].  Chart creation errors have been sought, but may not always  have been located. Such creation errors do not reflect on  the standard of medical care.

## 2023-09-20 DIAGNOSIS — G4733 Obstructive sleep apnea (adult) (pediatric): Secondary | ICD-10-CM | POA: Diagnosis not present

## 2023-09-23 ENCOUNTER — Ambulatory Visit: Attending: Internal Medicine | Admitting: Internal Medicine

## 2023-09-23 ENCOUNTER — Encounter: Payer: Self-pay | Admitting: Internal Medicine

## 2023-09-23 ENCOUNTER — Other Ambulatory Visit: Payer: Self-pay | Admitting: Internal Medicine

## 2023-09-23 VITALS — BP 108/60 | HR 77 | Resp 14 | Ht 74.0 in | Wt 259.0 lb

## 2023-09-23 DIAGNOSIS — M159 Polyosteoarthritis, unspecified: Secondary | ICD-10-CM

## 2023-09-23 DIAGNOSIS — R760 Raised antibody titer: Secondary | ICD-10-CM | POA: Diagnosis not present

## 2023-09-23 DIAGNOSIS — Z79899 Other long term (current) drug therapy: Secondary | ICD-10-CM

## 2023-09-23 DIAGNOSIS — I2699 Other pulmonary embolism without acute cor pulmonale: Secondary | ICD-10-CM | POA: Diagnosis not present

## 2023-09-23 DIAGNOSIS — M1380 Other specified arthritis, unspecified site: Secondary | ICD-10-CM

## 2023-09-23 LAB — COMPREHENSIVE METABOLIC PANEL WITH GFR
AG Ratio: 2.1 (calc) (ref 1.0–2.5)
ALT: 24 U/L (ref 9–46)
AST: 26 U/L (ref 10–35)
Albumin: 4.6 g/dL (ref 3.6–5.1)
Alkaline phosphatase (APISO): 89 U/L (ref 35–144)
BUN: 18 mg/dL (ref 7–25)
CO2: 28 mmol/L (ref 20–32)
Calcium: 9.7 mg/dL (ref 8.6–10.3)
Chloride: 105 mmol/L (ref 98–110)
Creat: 1.11 mg/dL (ref 0.70–1.35)
Globulin: 2.2 g/dL (ref 1.9–3.7)
Glucose, Bld: 93 mg/dL (ref 65–99)
Potassium: 4.7 mmol/L (ref 3.5–5.3)
Sodium: 140 mmol/L (ref 135–146)
Total Bilirubin: 1.1 mg/dL (ref 0.2–1.2)
Total Protein: 6.8 g/dL (ref 6.1–8.1)
eGFR: 74 mL/min/{1.73_m2} (ref >=60)

## 2023-09-23 LAB — CBC WITH DIFFERENTIAL/PLATELET
Absolute Lymphocytes: 1109 {cells}/uL (ref 850–3900)
Absolute Monocytes: 546 {cells}/uL (ref 200–950)
Basophils Absolute: 101 {cells}/uL (ref 0–200)
Basophils Relative: 1.2 %
Eosinophils Absolute: 496 {cells}/uL (ref 15–500)
Eosinophils Relative: 5.9 %
HCT: 39.1 % (ref 38.5–50.0)
Hemoglobin: 12.8 g/dL — ABNORMAL LOW (ref 13.2–17.1)
MCH: 28.6 pg (ref 27.0–33.0)
MCHC: 32.7 g/dL (ref 32.0–36.0)
MCV: 87.5 fL (ref 80.0–100.0)
MPV: 10.3 fL (ref 7.5–12.5)
Monocytes Relative: 6.5 %
Neutro Abs: 6149 {cells}/uL (ref 1500–7800)
Neutrophils Relative %: 73.2 %
Platelets: 231 10*3/uL (ref 140–400)
RBC: 4.47 10*6/uL (ref 4.20–5.80)
RDW: 14 % (ref 11.0–15.0)
Total Lymphocyte: 13.2 %
WBC: 8.4 10*3/uL (ref 3.8–10.8)

## 2023-09-23 LAB — SEDIMENTATION RATE: Sed Rate: 9 mm/h (ref 0–20)

## 2023-09-23 MED ORDER — HYDROXYCHLOROQUINE SULFATE 200 MG PO TABS: 400.0000 mg | ORAL_TABLET | Freq: Every day | ORAL | 1 refills | Status: DC

## 2023-09-23 NOTE — Telephone Encounter (Signed)
 Last Fill: 03/26/2023  Eye exam: 08/23/2023   Labs: 06/10/2023 Glucose 113, Hemoglobin 12.2, HCT 37.4  Next Visit: 09/23/2023  Last Visit: 02/27/2023  [id number] inflammatory arthritis   Current Dose per office note 02/27/2023: hydroxychloroquine at 400 mg daily   Okay to refill Plaquenil?

## 2023-09-24 NOTE — Progress Notes (Signed)
 Sed rate of 9 remains normal.  Blood counts look good very slightly low hemoglobin of 12.8.  Kidney and liver function tests are normal.

## 2023-10-20 DIAGNOSIS — G4733 Obstructive sleep apnea (adult) (pediatric): Secondary | ICD-10-CM | POA: Diagnosis not present

## 2023-11-19 DIAGNOSIS — B351 Tinea unguium: Secondary | ICD-10-CM | POA: Diagnosis not present

## 2023-11-19 DIAGNOSIS — M2042 Other hammer toe(s) (acquired), left foot: Secondary | ICD-10-CM | POA: Diagnosis not present

## 2023-11-19 DIAGNOSIS — M2041 Other hammer toe(s) (acquired), right foot: Secondary | ICD-10-CM | POA: Diagnosis not present

## 2023-11-20 DIAGNOSIS — G4733 Obstructive sleep apnea (adult) (pediatric): Secondary | ICD-10-CM | POA: Diagnosis not present

## 2023-11-25 ENCOUNTER — Other Ambulatory Visit: Payer: Self-pay | Admitting: Hematology & Oncology

## 2023-11-30 DIAGNOSIS — J020 Streptococcal pharyngitis: Secondary | ICD-10-CM | POA: Diagnosis not present

## 2023-11-30 DIAGNOSIS — R051 Acute cough: Secondary | ICD-10-CM | POA: Diagnosis not present

## 2023-11-30 DIAGNOSIS — Z03818 Encounter for observation for suspected exposure to other biological agents ruled out: Secondary | ICD-10-CM | POA: Diagnosis not present

## 2023-12-09 ENCOUNTER — Encounter: Payer: Self-pay | Admitting: Family

## 2023-12-09 ENCOUNTER — Inpatient Hospital Stay: Payer: Medicare PPO | Admitting: Family

## 2023-12-09 ENCOUNTER — Inpatient Hospital Stay: Payer: Medicare PPO | Attending: Hematology & Oncology

## 2023-12-09 VITALS — BP 114/58 | HR 54 | Temp 98.8°F | Resp 18 | Wt 244.0 lb

## 2023-12-09 DIAGNOSIS — I824Y9 Acute embolism and thrombosis of unspecified deep veins of unspecified proximal lower extremity: Secondary | ICD-10-CM

## 2023-12-09 DIAGNOSIS — I2690 Septic pulmonary embolism without acute cor pulmonale: Secondary | ICD-10-CM | POA: Diagnosis not present

## 2023-12-09 DIAGNOSIS — D6862 Lupus anticoagulant syndrome: Secondary | ICD-10-CM | POA: Insufficient documentation

## 2023-12-09 DIAGNOSIS — R7989 Other specified abnormal findings of blood chemistry: Secondary | ICD-10-CM | POA: Diagnosis not present

## 2023-12-09 DIAGNOSIS — I2609 Other pulmonary embolism with acute cor pulmonale: Secondary | ICD-10-CM

## 2023-12-09 DIAGNOSIS — I2782 Chronic pulmonary embolism: Secondary | ICD-10-CM

## 2023-12-09 DIAGNOSIS — Z7901 Long term (current) use of anticoagulants: Secondary | ICD-10-CM | POA: Insufficient documentation

## 2023-12-09 DIAGNOSIS — D6859 Other primary thrombophilia: Secondary | ICD-10-CM | POA: Diagnosis not present

## 2023-12-09 LAB — CBC WITH DIFFERENTIAL (CANCER CENTER ONLY)
Abs Immature Granulocytes: 0.12 10*3/uL — ABNORMAL HIGH (ref 0.00–0.07)
Basophils Absolute: 0 10*3/uL (ref 0.0–0.1)
Basophils Relative: 0 %
Eosinophils Absolute: 0.2 10*3/uL (ref 0.0–0.5)
Eosinophils Relative: 2 %
HCT: 40.9 % (ref 39.0–52.0)
Hemoglobin: 13.3 g/dL (ref 13.0–17.0)
Immature Granulocytes: 1 %
Lymphocytes Relative: 16 %
Lymphs Abs: 1.4 10*3/uL (ref 0.7–4.0)
MCH: 27.8 pg (ref 26.0–34.0)
MCHC: 32.5 g/dL (ref 30.0–36.0)
MCV: 85.4 fL (ref 80.0–100.0)
Monocytes Absolute: 0.6 10*3/uL (ref 0.1–1.0)
Monocytes Relative: 7 %
Neutro Abs: 6.5 10*3/uL (ref 1.7–7.7)
Neutrophils Relative %: 74 %
Platelet Count: 283 10*3/uL (ref 150–400)
RBC: 4.79 MIL/uL (ref 4.22–5.81)
RDW: 14.6 % (ref 11.5–15.5)
WBC Count: 8.8 10*3/uL (ref 4.0–10.5)
nRBC: 0 % (ref 0.0–0.2)

## 2023-12-09 LAB — CMP (CANCER CENTER ONLY)
ALT: 22 U/L (ref 0–44)
AST: 17 U/L (ref 15–41)
Albumin: 4.5 g/dL (ref 3.5–5.0)
Alkaline Phosphatase: 84 U/L (ref 38–126)
Anion gap: 9 (ref 5–15)
BUN: 32 mg/dL — ABNORMAL HIGH (ref 8–23)
CO2: 25 mmol/L (ref 22–32)
Calcium: 9.4 mg/dL (ref 8.9–10.3)
Chloride: 103 mmol/L (ref 98–111)
Creatinine: 1.22 mg/dL (ref 0.61–1.24)
GFR, Estimated: >60 mL/min (ref >60)
Glucose, Bld: 104 mg/dL — ABNORMAL HIGH (ref 70–99)
Potassium: 4.3 mmol/L (ref 3.5–5.1)
Sodium: 137 mmol/L (ref 135–145)
Total Bilirubin: 0.7 mg/dL (ref 0.0–1.2)
Total Protein: 7.2 g/dL (ref 6.5–8.1)

## 2023-12-09 LAB — LACTATE DEHYDROGENASE: LDH: 181 U/L (ref 98–192)

## 2023-12-09 LAB — D-DIMER, QUANTITATIVE: D-Dimer, Quant: 1.52 ug{FEU}/mL — ABNORMAL HIGH (ref 0.00–0.50)

## 2023-12-09 NOTE — Progress Notes (Signed)
 Hematology and Oncology Follow Up Visit  Tony Frye [id number] 1960-07-19 63 y.o. 12/09/2023   Principle Diagnosis:  History of pulmonary embolism-post left knee surgery-01/2020 -- transition positive lupus anticoagulant   Current Therapy:        Xarelto  10 mg PO daily   Interim History:  Tony Frye is here today for follow-up. He is doing well at this time.  No issues with Xarelto . He is taking as prescribed.  No blood loss, bruising or petechiae noted.  He has restarted low dose testosterone  as he feels this significantly increases his quality of life. We discussed how this could effects his clotting time and he will continue to take his XArelto  as prescribed.  He states that he needs an inguinal hernia repair but plans to put this off until next year in favor of right know arthroscopy. He will let us  know if he schedules surgery so can adjust his DVT/PE prophylaxis.  No fever, chills, n/v, rash, dizziness, SOB, chest pain, palpitations, abdominal pain or changes in bowel or bladder habits.  He is getting over bronchitis with cough.  Pedal pulses are 1+.  No falls or syncope. He is working out at [employer] to help with strengthening.  Appetite and hydration are good. Weight is stable at 244 lbs.   ECOG Performance Status: 1 - Symptomatic but completely ambulatory  Medications:  Allergies as of 12/09/2023       Reactions   Buprenorphine Hcl Rash   Esomeprazole Magnesium Nausea And Vomiting, Other (See Comments)   Morphine Rash   Morphine And Codeine Rash        Medication List        Accurate as of December 09, 2023 10:50 AM. If you have any questions, ask your nurse or doctor.          buPROPion  300 MG 24 hr tablet Commonly known as: WELLBUTRIN  XL Take 300 mg by mouth every morning.   celecoxib  200 MG capsule Commonly known as: CELEBREX  TAKE ONE CAPSULE BY MOUTH TWICE DAILY   hydroxychloroquine  200 MG tablet Commonly known as: PLAQUENIL  Take 2 tablets (400 mg  total) by mouth daily.   levocetirizine 5 MG tablet Commonly known as: XYZAL Take 5 mg by mouth every evening.   lisinopril  10 MG tablet Commonly known as: ZESTRIL  Take 10 mg by mouth daily.   methocarbamol  500 MG tablet Commonly known as: ROBAXIN  Take 500-1,000 mg by mouth every 6 (six) hours as needed.   multivitamin with minerals Tabs tablet Take 1 tablet by mouth daily.   OVER THE COUNTER MEDICATION daily. CLA Supplement   pantoprazole  40 MG tablet Commonly known as: PROTONIX  Take 40 mg by mouth daily.   pregabalin  150 MG capsule Commonly known as: LYRICA  Take 150 mg by mouth 2 (two) times daily.   rosuvastatin  10 MG tablet Commonly known as: CRESTOR  Take 10 mg by mouth daily.   tamsulosin  0.4 MG Caps capsule Commonly known as: FLOMAX  Take 0.4 mg by mouth every evening.   testosterone  cypionate 200 MG/ML injection Commonly known as: DEPOTESTOSTERONE CYPIONATE Inject 200 mg into the muscle every 28 (twenty-eight) days.   Xarelto  10 MG Tabs tablet Generic drug: rivaroxaban  TAKE ONE TABLET BY MOUTH EVERY DAY        Allergies:  Allergies  Allergen Reactions   Buprenorphine Hcl Rash   Esomeprazole Magnesium Nausea And Vomiting and Other (See Comments)   Morphine Rash   Morphine And Codeine Rash    Past Medical History, Surgical  history, Social history, and Family History were reviewed and updated.  Review of Systems: All other 10 point review of systems is negative.   Physical Exam:  vitals were not taken for this visit.   Wt Readings from Last 3 Encounters:  09/23/23 259 lb (117.5 kg)  06/10/23 264 lb 12.8 oz (120.1 kg)  02/27/23 241 lb (109.3 kg)    Ocular: Sclerae unicteric, pupils equal, round and reactive to light Ear-nose-throat: Oropharynx clear, dentition fair Lymphatic: No cervical or supraclavicular adenopathy Lungs no rales or rhonchi, good excursion bilaterally Heart regular rate and rhythm, no murmur appreciated Abd soft,  nontender, positive bowel sounds MSK no focal spinal tenderness, no joint edema Neuro: non-focal, well-oriented, appropriate affect Breasts: Deferred   Lab Results  Component Value Date   WBC 8.8 12/09/2023   HGB 13.3 12/09/2023   HCT 40.9 12/09/2023   MCV 85.4 12/09/2023   PLT 283 12/09/2023   No results found for: FERRITIN, IRON, TIBC, UIBC, IRONPCTSAT Lab Results  Component Value Date   RBC 4.79 12/09/2023   No results found for: KPAFRELGTCHN, LAMBDASER, KAPLAMBRATIO No results found for: IGGSERUM, IGA, IGMSERUM No results found for: STEPHANY CARLOTA BENSON MARKEL EARLA JOANNIE DOC VICK, SPEI   Chemistry      Component Value Date/Time   NA 140 09/23/2023 1224   K 4.7 09/23/2023 1224   CL 105 09/23/2023 1224   CO2 28 09/23/2023 1224   BUN 18 09/23/2023 1224   CREATININE 1.11 09/23/2023 1224      Component Value Date/Time   CALCIUM  9.7 09/23/2023 1224   ALKPHOS 91 06/10/2023 1023   AST 26 09/23/2023 1224   AST 26 06/10/2023 1023   ALT 24 09/23/2023 1224   ALT 23 06/10/2023 1023   BILITOT 1.1 09/23/2023 1224   BILITOT 0.8 06/10/2023 1023       Impression and Plan: Tony Frye is a very pleasant 63 yo caucasian gentleman with thromboembolic disease on maintenance Xarelto  lifelong.  D-dimer remains elevated. He had transient positive lupus anticoagulant.  He is tolerating Xarelto  nicely and taking as prescribed. No change indicated at this time.  Follow-up with lab in 6 months.   Lauraine Pepper, NP 6/30/202510:50 AM

## 2023-12-10 LAB — CARDIOLIPIN ANTIBODIES, IGG, IGM, IGA
Anticardiolipin IgA: <9 U/mL (ref 0–11)
Anticardiolipin IgG: <9 GPL U/mL (ref 0–14)
Anticardiolipin IgM: <9 [MPL'U]/mL (ref 0–12)

## 2023-12-10 LAB — DRVVT MIX: dRVVT Mix: 59.2 s — ABNORMAL HIGH (ref 0.0–40.4)

## 2023-12-10 LAB — LUPUS ANTICOAGULANT PANEL
DRVVT: 77.7 s — ABNORMAL HIGH (ref 0.0–47.0)
PTT Lupus Anticoagulant: 33.6 s (ref 0.0–43.5)

## 2023-12-10 LAB — DRVVT CONFIRM: dRVVT Confirm: 1.4 ratio — ABNORMAL HIGH (ref 0.8–1.2)

## 2023-12-18 ENCOUNTER — Other Ambulatory Visit: Payer: Self-pay | Admitting: Internal Medicine

## 2023-12-18 DIAGNOSIS — M1380 Other specified arthritis, unspecified site: Secondary | ICD-10-CM

## 2023-12-18 DIAGNOSIS — R760 Raised antibody titer: Secondary | ICD-10-CM

## 2023-12-19 DIAGNOSIS — Z96651 Presence of right artificial knee joint: Secondary | ICD-10-CM | POA: Diagnosis not present

## 2023-12-19 DIAGNOSIS — M25561 Pain in right knee: Secondary | ICD-10-CM | POA: Diagnosis not present

## 2023-12-20 DIAGNOSIS — G4733 Obstructive sleep apnea (adult) (pediatric): Secondary | ICD-10-CM | POA: Diagnosis not present

## 2023-12-26 DIAGNOSIS — G4733 Obstructive sleep apnea (adult) (pediatric): Secondary | ICD-10-CM | POA: Diagnosis not present

## 2024-01-02 DIAGNOSIS — L739 Follicular disorder, unspecified: Secondary | ICD-10-CM | POA: Diagnosis not present

## 2024-01-02 DIAGNOSIS — E291 Testicular hypofunction: Secondary | ICD-10-CM | POA: Diagnosis not present

## 2024-01-02 DIAGNOSIS — Z1329 Encounter for screening for other suspected endocrine disorder: Secondary | ICD-10-CM | POA: Diagnosis not present

## 2024-01-20 DIAGNOSIS — G4733 Obstructive sleep apnea (adult) (pediatric): Secondary | ICD-10-CM | POA: Diagnosis not present

## 2024-02-20 DIAGNOSIS — G4733 Obstructive sleep apnea (adult) (pediatric): Secondary | ICD-10-CM | POA: Diagnosis not present

## 2024-02-27 ENCOUNTER — Other Ambulatory Visit: Payer: Self-pay | Admitting: Hematology & Oncology

## 2024-03-13 NOTE — Progress Notes (Signed)
 Office Visit Note  Patient: Tony Frye             Date of Birth: Oct 27, 1960           MRN: [id number]             PCP: Nanci Senior, MD Referring: Nanci Senior, MD Visit Date: 03/26/2024   Subjective:  Medical Management of Chronic Issues (Having some concerns with his vision , eye exam was normal for PLQ toxicity, was given a new prescription of glasses and its still not helping everything is blurred with glasses and without glasses but its magnified with the glasses. )    Discussed the use of AI scribe software for clinical note transcription with the patient, who gave verbal consent to proceed.  History of Present Illness   Tony Frye is a 63 y.o. male here for follow up for rheumatoid arthritis and lupus anticoagulant disorder on HCQ 400 mg daily.   He has experienced worsening blurry vision since receiving a new prescription for glasses. Initially, he got new glasses with a higher prescription but never adjusted to them. After two months, he returned to the optometrist, who suggested a slight increase in prescription, which provided minimal improvement. The blurriness has persisted and worsened, particularly noticeable when driving, where he finds it difficult to read signs clearly. No significant difference in vision between light and dark conditions.  He questions whether his medication, hydroxychloroquine , could be contributing to his vision issues. He has been on hydroxychloroquine  for lupus management and previously attempted to discontinue it for two weeks, which resulted in significant discomfort and immobility, prompting him to resume the medication. Eye exam in March 2025 was negative for any abnormality on visual field or OCT.  He also takes Xarelto  due to increased clotting risk associated with testosterone  therapy, which he resumed after an eight-month hiatus due to feeling unwell without it. He reports feeling better on the hormones and has lost 18 pounds  since his last visit.  He also reports varicose veins in his legs, which he attributes to age and possibly his lupus anticoagulant. He notes that his legs sometimes swell, but the swelling subsides after a couple of days.  He describes experiencing burning tongue syndrome for over six months diagnosed during routine dental appointment, characterized by a 'blood red' tongue and irritation, especially with spicy or salty foods. He has tried various over-the-counter treatments, which provide temporary relief.  Socially, he has been working out regularly since January and feels physically better overall. He is motivated to maintain his strength, partly due to his wife's severe osteoporosis and her history of back problems, including a successful neck fusion surgery. He is focused on losing additional weight to improve his health and support his wife if needed.       Previous HPI 09/23/2023 Tony Frye is a 63 y.o. male here for follow up for inflammatory arthritis and lupus anticoagulant disorder on HCQ 400 mg daily.     He engages in workout classes three to five times a week, including weight lifting, which has improved his overall physical function, allowing him to perform daily activities more easily. Despite these improvements, he notes persistent knee pain, particularly after exercise, and ongoing back issues. He avoids high-impact exercises and modifies activities to accommodate his knee and back pain.   He experiences persistent knee pain, especially after exercise. Weight loss and body composition changes are ongoing, which may help reduce knee stress.   He continues  to experience back pain, noting that he had a spinal fusion of the L4-L5 vertebrae, but the adjacent L3 vertebra has since deteriorated. He attributes this to his active lifestyle, including remodeling and gardening activities.   He has a history of a full tear in his shoulder, which he manages by avoiding certain exercises  like bench pressing.   He describes issues with his hands, including arthritis and trigger fingers, particularly in his thumbs, which sometimes get stuck. He has had carpal tunnel surgery on one hand, which alleviated some numbness, but he has not yet had surgery on his dominant hand.   He reports concerns about varicose veins and venous stasis dermatitis in his legs, noting darkening of the skin and visible veins. He has a history of blood clots, which can contribute to these symptoms.   He is currently taking Celebrex  twice daily, which he finds more effective than diclofenac , especially since diclofenac  interfered with his Xarelto . He also takes hydroxychloroquine , which he finds effective for managing his lupus anticoagulant condition. His D-dimer levels have remained elevated since a past blood clot, but he has not experienced any new clotting events.    Previous HPI 02/27/2023 Tony Frye is a 63 y.o. male here for follow up for chronic joint pain and fatigue associated with persistent serum inflammatory markers and lupus anticoagulant disorder.  Since starting hydroxychloroquine  he noticed a significant improvement in joint pain and stiffness although he still has quite a bit in his back first thing in the morning and especially when sitting for prolonged time.  Bilateral trigger fingers worst at right fourth digit has got little more bothersome especially gets stuck and has trouble with that first thing in the morning.  He had bilateral carpal tunnel release surgery with Dr. Ahmad which went well but has been slow to fully heal still gets soreness with pressure on his hands.  He had diarrhea after starting hydroxychloroquine  so interrupted his treatment for about 2 weeks and noticed worsening symptoms at that time but has been tolerating it well since resuming.   Previous HPI 12/26/2022 Tony Frye is a 64 y.o. male here for follow up for chronic joint pain and fatigue associated with  persistent serum inflammatory markers and lupus anticoagulant disorder.  Exam at our initial visit was consistent with generalized osteoarthritis changes laboratory testing did show mild elevation in sedimentation rate at 25 borderline positive ANA 1: 40 and phosphatidylserine IgG of 35.  Symptoms remain about the same having issues with pain and stiffness in multiple areas especially after long distance driving or otherwise spending a long time in a seated position.  Also describes just a very generalized fatigue with low exertion tolerance and energy level.  Not specifically excessive daytime sleepiness.   Previous HPI 11/20/22 Tony Frye is a 63 y.o. male here for evaluation of chronic joint pain affecting multiple areas with history of blood clot with positive lupus anticoagulant and persistent elevated D-dimer.  He was referred by Dr. Timmy who has been seeing him for his anticoagulation on Xarelto  10 mg daily he has been on this continuously after pulmonary embolism in 2021 provoked following left knee surgery.  He did not have any previous diagnosis of lupus associated with this.  He has degenerative arthritis of multiple areas.  A lot of problems from his chronic back pain has known degenerative disc disease and had previous lumbar laminectomy from L4-S1 and subsequently developing more problems at the L3-L4 level.  Also had bilateral total  knee replacements for osteoarthritis.  Left knee has required 1 revision surgery.  Previous history of rotator cuff tear he is planned for left shoulder surgery for this.  Also had right shoulder steroid injection about 6 weeks ago that is partially helpful.  He takes Celebrex  and Lyrica  for joint pain and stiffness knees improve his symptoms partially.  He takes tramadol  intermittently this is beneficial when he takes it.     Review of Systems  Constitutional:  Positive for fatigue.  HENT:  Positive for mouth sores. Negative for mouth dryness.   Eyes:   Positive for visual disturbance. Negative for dryness.  Respiratory:  Negative for shortness of breath.   Cardiovascular:  Negative for chest pain and palpitations.  Gastrointestinal:  Negative for blood in stool, constipation and diarrhea.  Endocrine: Positive for increased urination.  Genitourinary:  Negative for involuntary urination.  Musculoskeletal:  Positive for joint pain, joint pain, joint swelling, myalgias, muscle weakness, morning stiffness, muscle tenderness and myalgias. Negative for gait problem.  Skin:  Positive for hair loss. Negative for color change, rash and sensitivity to sunlight.  Allergic/Immunologic: Negative for susceptible to infections.  Neurological:  Negative for dizziness and headaches.  Hematological:  Negative for swollen glands.  Psychiatric/Behavioral:  Negative for depressed mood and sleep disturbance. The patient is not nervous/anxious.     PMFS History:  Patient Active Problem List   Diagnosis Date Noted   Burning tongue syndrome 03/26/2024   Seronegative inflammatory arthritis 12/26/2022   Antiphospholipid antibody positive 11/20/2022   Generalized osteoarthritis of multiple sites 11/20/2022   Myofascial pain 11/20/2022   Intractable pain 04/03/2022   Pulmonary embolism (HCC) 06/19/2021   Radiculopathy 04/26/2021   Chronic back pain 04/18/2021   Leukocytosis 01/23/2020   Persistent cough 01/23/2020   Pulmonary embolism and infarction (HCC) 01/23/2020   S/P revision of total knee, left 01/15/2020   Mechanical loosening of internal left knee prosthetic joint 01/12/2020   Failed total left knee replacement 12/29/2019   Bilateral carpal tunnel syndrome 11/09/2015   Primary osteoarthritis of both first carpometacarpal joints 11/09/2015   Alcohol use disorder, moderate, dependence (HCC) 07/26/2015   Mild episode of recurrent major depressive disorder 07/26/2015    Past Medical History:  Diagnosis Date   Cancer (HCC)    squamous cell, face    Depressed    Hiatal hernia    Hypertension    Low testosterone     OSA on CPAP    Osteoarthritis    both knees   Pulmonary embolism (HCC) 06/19/2021   Reflux esophagitis     Family History  Problem Relation Age of Onset   Cancer - Colon Mother    Cancer - Other Mother    Heart disease Father    Hypertension Maternal Grandfather    Past Surgical History:  Procedure Laterality Date   ABDOMINAL EXPOSURE N/A 04/26/2021   Procedure: ABDOMINAL EXPOSURE;  Surgeon: Gretta Lonni PARAS, MD;  Location: [facility] OR;  Service: Vascular;  Laterality: N/A;   ANTERIOR LUMBAR FUSION N/A 04/26/2021   Procedure: ANTERIOR LUMBAR INTERBODY FUSION LUMBAR 4- LUMBAR 5, LUMBAR 5- SACRUM 1 WITH INSTRUMENTATION AND ALLOGRAFT;  Surgeon: Beuford Anes, MD;  Location: [facility] OR;  Service: Orthopedics;  Laterality: N/A;   BACK SURGERY     JOINT REPLACEMENT     KNEE ARTHROSCOPY Right    LUMBAR LAMINECTOMY  2022   partial ampu     REPLACEMENT TOTAL KNEE Left 2008   revision, August 2021   REPLACEMENT TOTAL KNEE Right  2010   SQUAMOUS CELL CARCINOMA EXCISION     Social History   Social History Narrative   Not on file   Immunization History  Administered Date(s) Administered   Influenza, Seasonal, Injecte, Preservative Fre 02/13/2018   Influenza,inj,Quad PF,6+ Mos 03/27/2019   Influenza,inj,Quad PF,6-35 Mos 03/27/2019   PFIZER(Purple Top)SARS-COV-2 Vaccination 08/27/2019, 09/17/2019, 03/29/2020     Objective: Vital Signs: BP 112/70   Pulse (!) 53   Temp 97.8 F (36.6 C)   Resp 16   Ht 6' 2 (1.88 m)   Wt 241 lb 6.4 oz (109.5 kg)   BMI 30.99 kg/m    Physical Exam Eyes:     Conjunctiva/sclera: Conjunctivae normal.  Cardiovascular:     Rate and Rhythm: Normal rate and regular rhythm.  Pulmonary:     Effort: Pulmonary effort is normal.     Breath sounds: Normal breath sounds.  Musculoskeletal:     Right lower leg: No edema.     Left lower leg: No edema.  Lymphadenopathy:     Cervical: No cervical  adenopathy.  Skin:    General: Skin is warm and dry.     Comments: Tortuous lower extremity veins, mild overlying hyperpigmentation No lesions, no pitting edema   Neurological:     Mental Status: He is alert.  Psychiatric:        Mood and Affect: Mood normal.      Musculoskeletal Exam:  Shoulders full ROM left shoulder slow/guarding with active ROM above horizontal, no focal tenderness to pressure Elbows full ROM no tenderness or swelling Wrists full ROM no tenderness or swelling Fingers full ROM chronic bony widening 1st CMC, MCPs, and DIP heberdon's nodes, no finger triggering Knees full ROM chronic postsurgical changes  Investigation: No additional findings.  Imaging: No results found.  Recent Labs: Lab Results  Component Value Date   WBC 8.8 12/09/2023   HGB 13.3 12/09/2023   PLT 283 12/09/2023   NA 137 12/09/2023   K 4.3 12/09/2023   CL 103 12/09/2023   CO2 25 12/09/2023   GLUCOSE 104 (H) 12/09/2023   BUN 32 (H) 12/09/2023   CREATININE 1.22 12/09/2023   BILITOT 0.7 12/09/2023   ALKPHOS 84 12/09/2023   AST 17 12/09/2023   ALT 22 12/09/2023   PROT 7.2 12/09/2023   ALBUMIN 4.5 12/09/2023   CALCIUM  9.4 12/09/2023   GFRAA  04/06/2010    >60        The eGFR has been calculated using the MDRD equation. This calculation has not been validated in all clinical situations. eGFR's persistently <60 mL/min signify possible Chronic Kidney Disease.    Speciality Comments: PLQ Eye Exam: 08/23/2023 Normal @[facility]  [place] f/u 1 year  Procedures:  No procedures performed Allergies: Buprenorphine hcl, Esomeprazole magnesium, Morphine, and Morphine and codeine   Assessment / Plan:     Visit Diagnoses: Seronegative inflammatory arthritis - Plan: Sedimentation rate Rheumatoid arthritis managed with hydroxychloroquine  and doing well no significant daily symptoms or flares. Trigger finger doing better without additional intervention. Hand numbness on left side likely  related to carpal tunnel syndrome but also some left arm impingement symptoms probably related to the rotator cuff disease. - Continue HCQ 400 mg daily - Checking sed rate for monitoring activity  High risk medication use - hydroxychloroquine  at 400 mg daily, Celebrex  200 mg twice daily. - Plan: CBC with Differential/Platelet, Basic Metabolic Panel (BMET) - Checking CBC and Bmet for medication monitoring on long term HCQ and celebrex  as well as anticoagulation  Varicose veins of lower extremities Varicose veins related to previous blood clots, with venous reflux and skin changes. Minimal risk of superficial clots due to Xarelto . Does not have severe edema or localized symptom complaints to warrant seeking vascular procedure or intervention at this time. - Continue Xarelto  for anticoagulation  Burning mouth syndrome (glossodynia) Burning mouth syndrome likely due to small fiber neuropathy or dry mouth. Over-the-counter oils provide temporary relief. - Continue using over-the-counter oils for symptom relief. - Consider sugar-free gums or lozenges to increase saliva production. - Avoid spicy and salty foods to reduce irritation.  Visual disturbances Visual disturbances likely due to refractive error or age-related changes, not typical for hydroxychloroquine  related toxicity. Recent eye exam showed no inflammation. I discussed possibility of trial off medication to assess but prefers not at this time due to previously severe worsening arthritis after 2 weeks without treatment.       Orders: Orders Placed This Encounter  Procedures   Sedimentation rate   CBC with Differential/Platelet   Basic Metabolic Panel (BMET)   No orders of the defined types were placed in this encounter.    Follow-Up Instructions: Return in about 6 months (around 09/24/2024) for OA/APS/RA on HCQ f/u 6mos.   Lonni LELON Ester, MD  Note - This record has been created using [employer].  Chart creation  errors have been sought, but may not always  have been located. Such creation errors do not reflect on  the standard of medical care.

## 2024-03-19 ENCOUNTER — Other Ambulatory Visit: Payer: Self-pay | Admitting: Internal Medicine

## 2024-03-19 DIAGNOSIS — R760 Raised antibody titer: Secondary | ICD-10-CM

## 2024-03-19 DIAGNOSIS — M1380 Other specified arthritis, unspecified site: Secondary | ICD-10-CM

## 2024-03-19 NOTE — Telephone Encounter (Signed)
 Last Fill: 09/23/2023  Eye exam: 08/23/2023 Normal   Labs: 12/09/2023 Abs Immature Granulocytes 0.12, Glucose 104, BUN 32  Next Visit: 03/26/2024  Last Visit: 09/23/2023  IK:Dzmnwzhjupcz inflammatory arthritis   Current Dose per office note 09/23/2023: hydroxychloroquine  at 400 mg daily   Okay to refill Plaquenil ?

## 2024-03-21 DIAGNOSIS — G4733 Obstructive sleep apnea (adult) (pediatric): Secondary | ICD-10-CM | POA: Diagnosis not present

## 2024-03-25 ENCOUNTER — Other Ambulatory Visit: Payer: Self-pay | Admitting: Hematology & Oncology

## 2024-03-26 ENCOUNTER — Ambulatory Visit: Attending: Internal Medicine | Admitting: Internal Medicine

## 2024-03-26 ENCOUNTER — Encounter: Payer: Self-pay | Admitting: Internal Medicine

## 2024-03-26 VITALS — BP 112/70 | HR 53 | Temp 97.8°F | Resp 16 | Ht 74.0 in | Wt 241.4 lb

## 2024-03-26 DIAGNOSIS — Z79899 Other long term (current) drug therapy: Secondary | ICD-10-CM | POA: Diagnosis not present

## 2024-03-26 DIAGNOSIS — K146 Glossodynia: Secondary | ICD-10-CM | POA: Diagnosis not present

## 2024-03-26 DIAGNOSIS — R760 Raised antibody titer: Secondary | ICD-10-CM | POA: Diagnosis not present

## 2024-03-26 DIAGNOSIS — M1380 Other specified arthritis, unspecified site: Secondary | ICD-10-CM

## 2024-03-26 NOTE — Patient Instructions (Signed)
 I recommend symptom treatments for eye dryness including lubricating eye drops and can use gel or ointment based products for overnight.  Also consider use of humidifier at night during dry weather.  Use follow-up regularly with your eye doctor.  If symptoms worsen there are several types of medicated eyedrop that can help with the dryness or inflammation.  For chronic dry mouth is important to stay well-hydrated.  You can also use sugar-free gum or lozenges to stimulate the saliva production.  Biotene mouthwash or lozenges may also be helpful.  Products into [employer] also work to stimulate saliva production.  Continue following up with your dentist regularly because dryness problems can increase the risk of tooth and gum decay.  If symptoms get worse there are medications for stimulating tear and saliva production but will try all the other options first.

## 2024-03-27 ENCOUNTER — Other Ambulatory Visit: Payer: Self-pay | Admitting: *Deleted

## 2024-03-27 DIAGNOSIS — M7918 Myalgia, other site: Secondary | ICD-10-CM

## 2024-03-27 DIAGNOSIS — M1380 Other specified arthritis, unspecified site: Secondary | ICD-10-CM

## 2024-03-27 DIAGNOSIS — Z79899 Other long term (current) drug therapy: Secondary | ICD-10-CM

## 2024-03-27 DIAGNOSIS — R760 Raised antibody titer: Secondary | ICD-10-CM

## 2024-03-27 LAB — BASIC METABOLIC PANEL WITH GFR
BUN/Creatinine Ratio: 26 (calc) — ABNORMAL HIGH (ref 6–22)
BUN: 34 mg/dL — ABNORMAL HIGH (ref 7–25)
CO2: 23 mmol/L (ref 20–32)
Calcium: 9.9 mg/dL (ref 8.6–10.3)
Chloride: 104 mmol/L (ref 98–110)
Creat: 1.33 mg/dL (ref 0.70–1.35)
Glucose, Bld: 91 mg/dL (ref 65–99)
Potassium: 4.9 mmol/L (ref 3.5–5.3)
Sodium: 138 mmol/L (ref 135–146)
eGFR: 60 mL/min/1.73m2 (ref >=60)

## 2024-03-27 LAB — CBC WITH DIFFERENTIAL/PLATELET
Absolute Lymphocytes: 1201 {cells}/uL (ref 850–3900)
Absolute Monocytes: 364 {cells}/uL (ref 200–950)
Basophils Absolute: 73 {cells}/uL (ref 0–200)
Basophils Relative: 1.4 %
Eosinophils Absolute: 255 {cells}/uL (ref 15–500)
Eosinophils Relative: 4.9 %
HCT: 46.3 % (ref 38.5–50.0)
Hemoglobin: 14.9 g/dL (ref 13.2–17.1)
MCH: 28.3 pg (ref 27.0–33.0)
MCHC: 32.2 g/dL (ref 32.0–36.0)
MCV: 88 fL (ref 80.0–100.0)
MPV: 10.7 fL (ref 7.5–12.5)
Monocytes Relative: 7 %
Neutro Abs: 3307 {cells}/uL (ref 1500–7800)
Neutrophils Relative %: 63.6 %
Platelets: 242 Thousand/uL (ref 140–400)
RBC: 5.26 Million/uL (ref 4.20–5.80)
RDW: 15 % (ref 11.0–15.0)
Total Lymphocyte: 23.1 %
WBC: 5.2 Thousand/uL (ref 3.8–10.8)

## 2024-03-27 LAB — SEDIMENTATION RATE

## 2024-04-01 ENCOUNTER — Other Ambulatory Visit: Payer: Self-pay

## 2024-04-01 DIAGNOSIS — M1380 Other specified arthritis, unspecified site: Secondary | ICD-10-CM | POA: Diagnosis not present

## 2024-04-01 DIAGNOSIS — Z79899 Other long term (current) drug therapy: Secondary | ICD-10-CM

## 2024-04-01 DIAGNOSIS — M7918 Myalgia, other site: Secondary | ICD-10-CM

## 2024-04-01 DIAGNOSIS — R760 Raised antibody titer: Secondary | ICD-10-CM | POA: Diagnosis not present

## 2024-04-02 LAB — SEDIMENTATION RATE: Sed Rate: 6 mm/h (ref 0–20)

## 2024-04-22 DIAGNOSIS — M25512 Pain in left shoulder: Secondary | ICD-10-CM | POA: Diagnosis not present

## 2024-04-22 DIAGNOSIS — S46012D Strain of muscle(s) and tendon(s) of the rotator cuff of left shoulder, subsequent encounter: Secondary | ICD-10-CM | POA: Diagnosis not present

## 2024-04-25 DIAGNOSIS — M25512 Pain in left shoulder: Secondary | ICD-10-CM | POA: Diagnosis not present

## 2024-04-29 DIAGNOSIS — S46012D Strain of muscle(s) and tendon(s) of the rotator cuff of left shoulder, subsequent encounter: Secondary | ICD-10-CM | POA: Diagnosis not present

## 2024-06-09 ENCOUNTER — Inpatient Hospital Stay: Attending: Hematology & Oncology

## 2024-06-09 ENCOUNTER — Inpatient Hospital Stay: Admitting: Family

## 2024-06-09 ENCOUNTER — Other Ambulatory Visit: Payer: Self-pay

## 2024-06-09 VITALS — BP 122/58 | HR 58 | Temp 97.8°F | Resp 19 | Ht 72.0 in | Wt 251.0 lb

## 2024-06-09 DIAGNOSIS — D6862 Lupus anticoagulant syndrome: Secondary | ICD-10-CM | POA: Insufficient documentation

## 2024-06-09 DIAGNOSIS — D6859 Other primary thrombophilia: Secondary | ICD-10-CM

## 2024-06-09 DIAGNOSIS — Z86711 Personal history of pulmonary embolism: Secondary | ICD-10-CM | POA: Diagnosis not present

## 2024-06-09 DIAGNOSIS — I2690 Septic pulmonary embolism without acute cor pulmonale: Secondary | ICD-10-CM | POA: Diagnosis not present

## 2024-06-09 DIAGNOSIS — I2609 Other pulmonary embolism with acute cor pulmonale: Secondary | ICD-10-CM

## 2024-06-09 DIAGNOSIS — I824Y9 Acute embolism and thrombosis of unspecified deep veins of unspecified proximal lower extremity: Secondary | ICD-10-CM

## 2024-06-09 DIAGNOSIS — I2782 Chronic pulmonary embolism: Secondary | ICD-10-CM

## 2024-06-09 LAB — D-DIMER, QUANTITATIVE: D-Dimer, Quant: 3.1 ug{FEU}/mL — ABNORMAL HIGH (ref 0.00–0.50)

## 2024-06-09 LAB — CMP (CANCER CENTER ONLY)
ALT: 31 U/L (ref 0–44)
AST: 32 U/L (ref 15–41)
Albumin: 4.5 g/dL (ref 3.5–5.0)
Alkaline Phosphatase: 102 U/L (ref 38–126)
Anion gap: 10 (ref 5–15)
BUN: 21 mg/dL (ref 8–23)
CO2: 28 mmol/L (ref 22–32)
Calcium: 9.4 mg/dL (ref 8.9–10.3)
Chloride: 103 mmol/L (ref 98–111)
Creatinine: 1.16 mg/dL (ref 0.61–1.24)
GFR, Estimated: >60 mL/min
Glucose, Bld: 104 mg/dL — ABNORMAL HIGH (ref 70–99)
Potassium: 4.4 mmol/L (ref 3.5–5.1)
Sodium: 141 mmol/L (ref 135–145)
Total Bilirubin: 0.8 mg/dL (ref 0.0–1.2)
Total Protein: 7 g/dL (ref 6.5–8.1)

## 2024-06-09 LAB — CBC WITH DIFFERENTIAL (CANCER CENTER ONLY)
Abs Immature Granulocytes: 0.02 K/uL (ref 0.00–0.07)
Basophils Absolute: 0.1 K/uL (ref 0.0–0.1)
Basophils Relative: 1 %
Eosinophils Absolute: 0.2 K/uL (ref 0.0–0.5)
Eosinophils Relative: 3 %
HCT: 39.9 % (ref 39.0–52.0)
Hemoglobin: 13.1 g/dL (ref 13.0–17.0)
Immature Granulocytes: 0 %
Lymphocytes Relative: 23 %
Lymphs Abs: 1.5 K/uL (ref 0.7–4.0)
MCH: 28.5 pg (ref 26.0–34.0)
MCHC: 32.8 g/dL (ref 30.0–36.0)
MCV: 86.9 fL (ref 80.0–100.0)
Monocytes Absolute: 0.6 K/uL (ref 0.1–1.0)
Monocytes Relative: 8 %
Neutro Abs: 4.3 K/uL (ref 1.7–7.7)
Neutrophils Relative %: 65 %
Platelet Count: 197 K/uL (ref 150–400)
RBC: 4.59 MIL/uL (ref 4.22–5.81)
RDW: 14.8 % (ref 11.5–15.5)
WBC Count: 6.7 K/uL (ref 4.0–10.5)
nRBC: 0 % (ref 0.0–0.2)

## 2024-06-09 LAB — LACTATE DEHYDROGENASE: LDH: 239 U/L — ABNORMAL HIGH (ref 105–235)

## 2024-06-09 NOTE — Progress Notes (Signed)
 " Hematology and Oncology Follow Up Visit  Tony Frye [id number] [birth year] 63 y.o. 06/09/2024   Principle Diagnosis:  History of pulmonary embolism-post left knee surgery-01/2020 -- transient positive lupus anticoagulant   Current Therapy:        Xarelto  10 mg PO daily   Interim History:  Tony Frye is here today for follow-up. He is doing fairly well.  He has chronic joint pain associated with osteoarthritis.  He will also be having surgery for left torn rotator cuff repair on 1/13. Her verbalized understanding that he is to hold his Xarelto  starting 3 days prior to surgery and restart the day after.  He denies any issues with abnormal blood loss.  No fever, chills, n/v, cough, rash, dizziness, SOB, chest pain, palpitations, abdominal pain or changes in bowel or bladder habits.  He has neuropathy in the toes of his right foot with history of lumbar spine injury.  No falls or syncope reported.  Appetite and hydration are good. Weight is stable at 251 lbs.   ECOG Performance Status: 1 - Symptomatic but completely ambulatory  Medications:  Allergies as of 06/09/2024       Reactions   Buprenorphine Hcl Rash   Esomeprazole Magnesium Nausea And Vomiting, Other (See Comments)   Morphine Rash   Morphine And Codeine Rash        Medication List        Accurate as of June 09, 2024  8:51 AM. If you have any questions, ask your nurse or doctor.          amoxicillin-clavulanate 875-125 MG tablet Commonly known as: AUGMENTIN Take 1 tablet by mouth 2 (two) times daily.   buPROPion  300 MG 24 hr tablet Commonly known as: WELLBUTRIN  XL Take 300 mg by mouth every morning.   celecoxib  200 MG capsule Commonly known as: CELEBREX  TAKE ONE CAPSULE BY MOUTH TWICE DAILY   HYDROcodone bit-homatropine 5-1.5 MG/5ML syrup Commonly known as: HYCODAN Take 5 mLs by mouth every 4 (four) hours as needed.   hydroxychloroquine  200 MG tablet Commonly known as: PLAQUENIL  Take 2  tablets (400 mg total) by mouth daily.   levocetirizine 5 MG tablet Commonly known as: XYZAL Take 5 mg by mouth every evening.   lisinopril  10 MG tablet Commonly known as: ZESTRIL  Take 10 mg by mouth daily.   Melatonin 10 MG Tabs Take 10 mg by mouth at bedtime as needed.   methocarbamol  500 MG tablet Commonly known as: ROBAXIN  Take 500-1,000 mg by mouth every 6 (six) hours as needed.   multivitamin with minerals Tabs tablet Take 1 tablet by mouth daily.   OVER THE COUNTER MEDICATION daily. CLA Supplement   pantoprazole  40 MG tablet Commonly known as: PROTONIX  Take 40 mg by mouth daily.   predniSONE 10 MG tablet Commonly known as: DELTASONE Take 10 mg by mouth daily with breakfast. For bronchitis What changed:  when to take this reasons to take this   pregabalin  150 MG capsule Commonly known as: LYRICA  Take 150 mg by mouth 2 (two) times daily.   rosuvastatin  10 MG tablet Commonly known as: CRESTOR  Take 10 mg by mouth daily.   tamsulosin  0.4 MG Caps capsule Commonly known as: FLOMAX  Take 0.4 mg by mouth every evening.   testosterone  cypionate 200 MG/ML injection Commonly known as: DEPOTESTOSTERONE CYPIONATE Inject 200 mg into the muscle every 28 (twenty-eight) days.   Xarelto  10 MG Tabs tablet Generic drug: rivaroxaban  TAKE ONE TABLET BY MOUTH EVERY DAY  Allergies: Allergies[1]  Past Medical History, Surgical history, Social history, and Family History were reviewed and updated.  Review of Systems: All other 10 point review of systems is negative.   Physical Exam:  vitals were not taken for this visit.   Wt Readings from Last 3 Encounters:  03/26/24 241 lb 6.4 oz (109.5 kg)  12/09/23 244 lb (110.7 kg)  09/23/23 259 lb (117.5 kg)    Ocular: Sclerae unicteric, pupils equal, round and reactive to light Ear-nose-throat: Oropharynx clear, dentition fair Lymphatic: No cervical or supraclavicular adenopathy Lungs no rales or rhonchi, good  excursion bilaterally Heart regular rate and rhythm, no murmur appreciated Abd soft, nontender, positive bowel sounds MSK no focal spinal tenderness, no joint edema Neuro: non-focal, well-oriented, appropriate affect Breasts: Deferred   Lab Results  Component Value Date   WBC 6.7 06/09/2024   HGB 13.1 06/09/2024   HCT 39.9 06/09/2024   MCV 86.9 06/09/2024   PLT 197 06/09/2024   No results found for: FERRITIN, IRON, TIBC, UIBC, IRONPCTSAT Lab Results  Component Value Date   RBC 4.59 06/09/2024   No results found for: KPAFRELGTCHN, LAMBDASER, KAPLAMBRATIO No results found for: IGGSERUM, IGA, IGMSERUM No results found for: STEPHANY CARLOTA BENSON MARKEL EARLA JOANNIE DOC VICK, SPEI   Chemistry      Component Value Date/Time   NA 138 03/26/2024 1053   K 4.9 03/26/2024 1053   CL 104 03/26/2024 1053   CO2 23 03/26/2024 1053   BUN 34 (H) 03/26/2024 1053   CREATININE 1.33 03/26/2024 1053      Component Value Date/Time   CALCIUM  9.9 03/26/2024 1053   ALKPHOS 84 12/09/2023 1026   AST 17 12/09/2023 1026   ALT 22 12/09/2023 1026   BILITOT 0.7 12/09/2023 1026       Impression and Plan:  Tony Frye is a very pleasant 63 yo caucasian gentleman with thromboembolic disease on maintenance Xarelto  lifelong.  D-dimer remains elevated at 3.10. He has had no s/s of recurrent clot.  He has transient positive lupus anticoagulant.  He is tolerating Xarelto  10 mg PO daily nicely and taking as prescribed. No change indicated at this time.  He will hold starting 3 days prior to his left shoulder surgery on 1/13 and restart the day after.  Follow-up with lab in 6 months.   Lauraine Pepper, NP 12/30/20258:51 AM     [1]  Allergies Allergen Reactions   Buprenorphine Hcl Rash   Esomeprazole Magnesium Nausea And Vomiting and Other (See Comments)   Morphine Rash   Morphine And Codeine Rash   "

## 2024-06-10 LAB — LUPUS ANTICOAGULANT PANEL
DRVVT: 35.3 s (ref 0.0–47.0)
PTT Lupus Anticoagulant: 33.8 s (ref 0.0–43.5)

## 2024-06-10 LAB — CARDIOLIPIN ANTIBODIES, IGG, IGM, IGA
Anticardiolipin IgA: <9 U/mL (ref 0–11)
Anticardiolipin IgG: 10 GPL U/mL (ref 0–14)
Anticardiolipin IgM: <9 [MPL'U]/mL (ref 0–12)

## 2024-06-22 ENCOUNTER — Other Ambulatory Visit: Payer: Self-pay | Admitting: Internal Medicine

## 2024-06-22 DIAGNOSIS — R760 Raised antibody titer: Secondary | ICD-10-CM

## 2024-06-22 DIAGNOSIS — M1380 Other specified arthritis, unspecified site: Secondary | ICD-10-CM

## 2024-06-22 NOTE — Telephone Encounter (Signed)
 Last Fill: 03/25/2024  Eye exam: 08/23/2023 Normal   Labs: 06/09/2024 Glucose 104 Rest of CBC and CMP WNL   Next Visit: 09/23/2024  Last Visit: 03/26/2024  IK:Dzmnwzhjupcz inflammatory arthritis   Current Dose per office note 03/26/2024: hydroxychloroquine  at 400 mg daily   Okay to refill Plaquenil ?

## 2024-09-23 ENCOUNTER — Ambulatory Visit: Admitting: Internal Medicine

## 2024-12-08 ENCOUNTER — Inpatient Hospital Stay

## 2024-12-08 ENCOUNTER — Inpatient Hospital Stay: Admitting: Family
